# Patient Record
Sex: Male | Born: 1945 | Race: White | Hispanic: No | Marital: Married | Smoking: Never smoker
Health system: Southern US, Community
[De-identification: ages and names within clinical notes are randomized; demographics above are authoritative.]

## PROBLEM LIST (undated history)

## (undated) DIAGNOSIS — R918 Other nonspecific abnormal finding of lung field: Secondary | ICD-10-CM

## (undated) DIAGNOSIS — C7951 Secondary malignant neoplasm of bone: Secondary | ICD-10-CM

## (undated) DIAGNOSIS — C649 Malignant neoplasm of unspecified kidney, except renal pelvis: Secondary | ICD-10-CM

## (undated) DIAGNOSIS — J45909 Unspecified asthma, uncomplicated: Secondary | ICD-10-CM

## (undated) DIAGNOSIS — E119 Type 2 diabetes mellitus without complications: Secondary | ICD-10-CM

## (undated) DIAGNOSIS — G9520 Unspecified cord compression: Secondary | ICD-10-CM

## (undated) DIAGNOSIS — N2889 Other specified disorders of kidney and ureter: Secondary | ICD-10-CM

## (undated) DIAGNOSIS — E875 Hyperkalemia: Secondary | ICD-10-CM

## (undated) DIAGNOSIS — Z8546 Personal history of malignant neoplasm of prostate: Secondary | ICD-10-CM

## (undated) DIAGNOSIS — Z923 Personal history of irradiation: Secondary | ICD-10-CM

## (undated) HISTORY — PX: OTHER SURGICAL HISTORY: SHX169

---

## 1998-06-10 ENCOUNTER — Ambulatory Visit (HOSPITAL_COMMUNITY): Admission: RE | Admit: 1998-06-10 | Discharge: 1998-06-10 | Payer: Self-pay | Admitting: Gastroenterology

## 2006-10-05 ENCOUNTER — Inpatient Hospital Stay (HOSPITAL_COMMUNITY): Admission: EM | Admit: 2006-10-05 | Discharge: 2006-10-07 | Payer: Self-pay | Admitting: Emergency Medicine

## 2006-11-24 ENCOUNTER — Ambulatory Visit (HOSPITAL_COMMUNITY): Admission: RE | Admit: 2006-11-24 | Discharge: 2006-11-24 | Payer: Self-pay | Admitting: Gastroenterology

## 2006-12-29 ENCOUNTER — Ambulatory Visit (HOSPITAL_BASED_OUTPATIENT_CLINIC_OR_DEPARTMENT_OTHER): Admission: RE | Admit: 2006-12-29 | Discharge: 2006-12-29 | Payer: Self-pay | Admitting: Otolaryngology

## 2006-12-29 ENCOUNTER — Encounter (INDEPENDENT_AMBULATORY_CARE_PROVIDER_SITE_OTHER): Payer: Self-pay | Admitting: Otolaryngology

## 2008-04-19 IMAGING — CT CT ANGIO CHEST
2 of 5 series · 19 of 36 positions shown · non-contrast
Comparison: None

CLINICAL DATA: Chest pain and elevated d-dimer.
TECHNIQUE: Multidetector CT imaging of the chest was performed during bolus
injection of intravenous contrast.  Multiplanar CT angiographic image
reconstructions were generated to evaluate the vascular anatomy.

[Series 7: pe 1.0 b20f st · axial · 0.78mm/px · z∈[-182,+74]mm · 16 of 292 slices shown]
[im 18/292  lung]
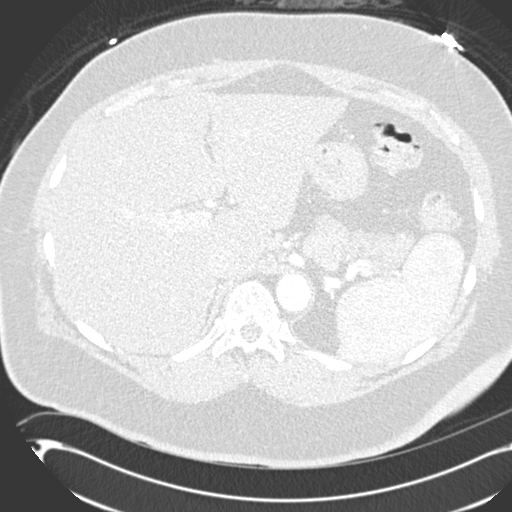
[im 35/292  mediastinal]
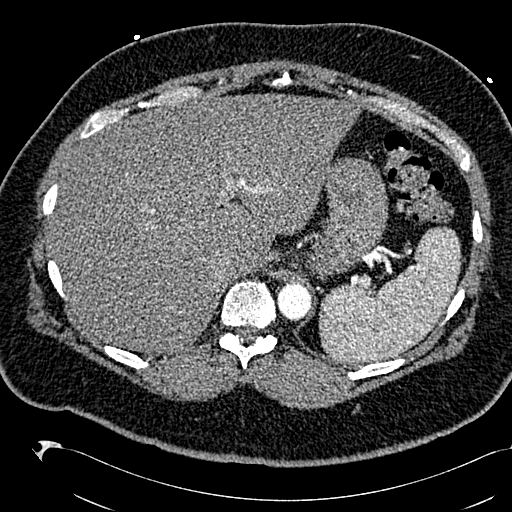
[im 52/292  lung]
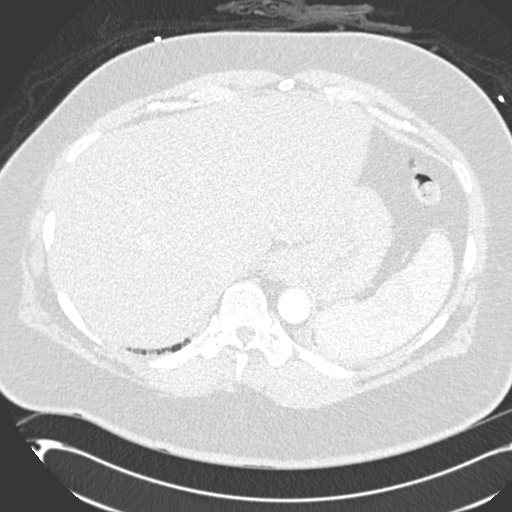
[im 69/292  mediastinal]
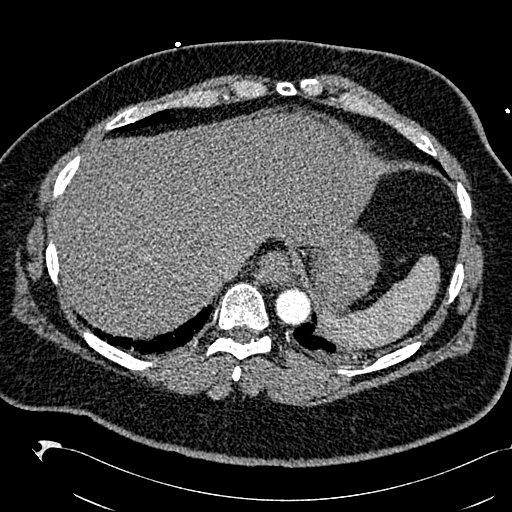
[im 86/292  lung]
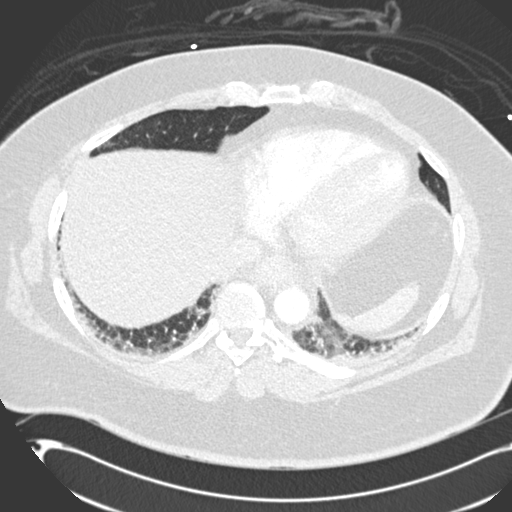
[im 103/292  mediastinal]
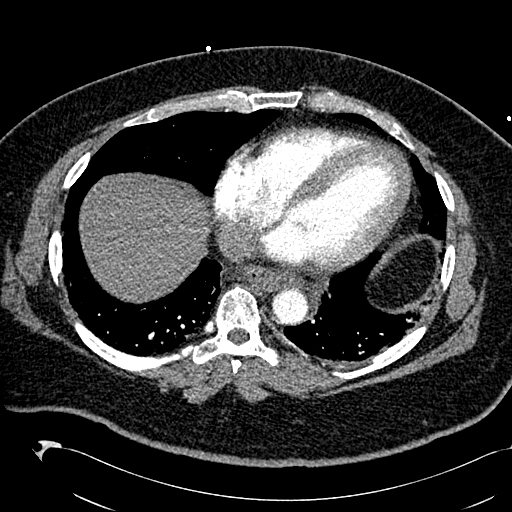
[im 120/292  lung]
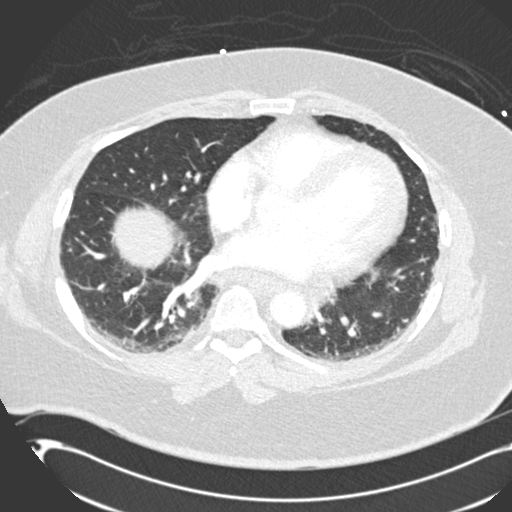
[im 137/292  mediastinal]
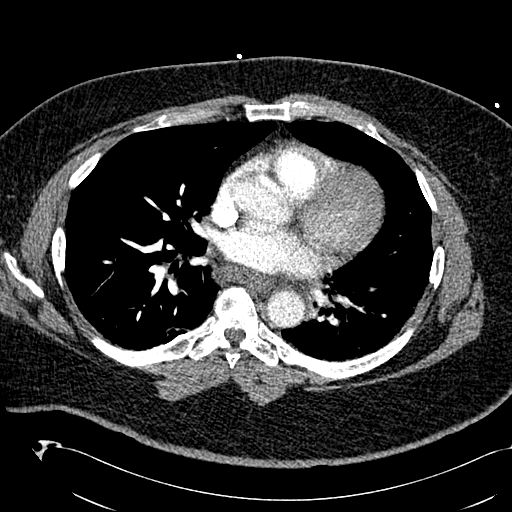
[im 155/292  lung]
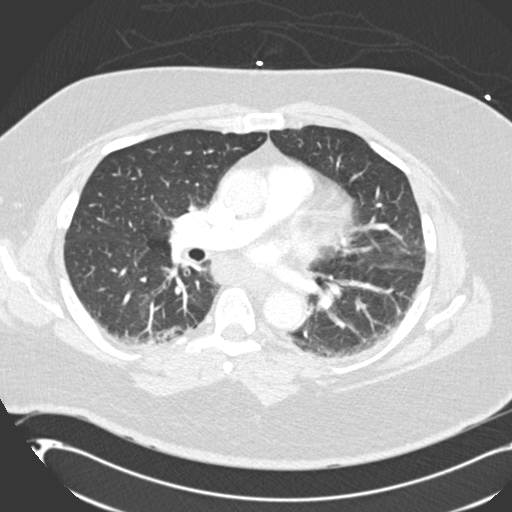
[im 172/292  mediastinal]
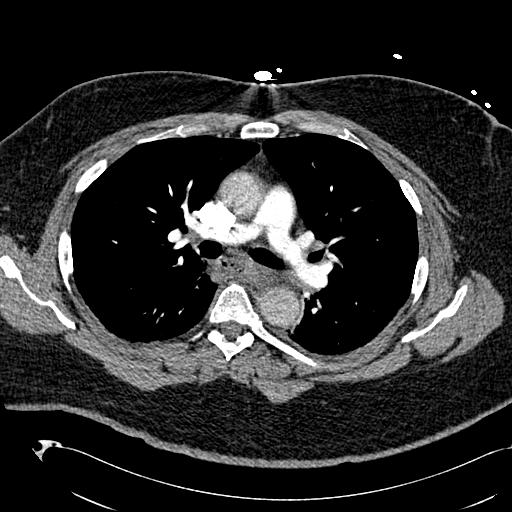
[im 189/292  lung]
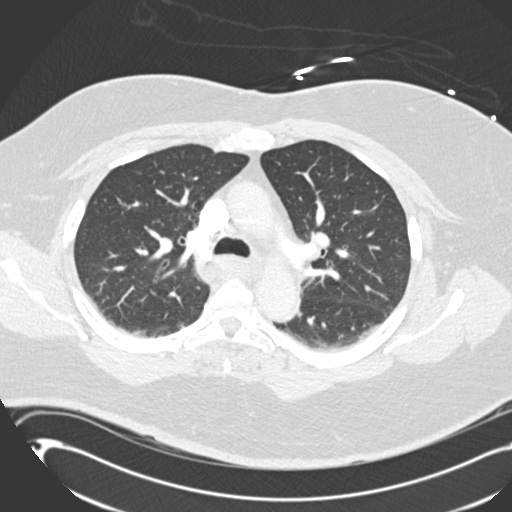
[im 206/292  mediastinal]
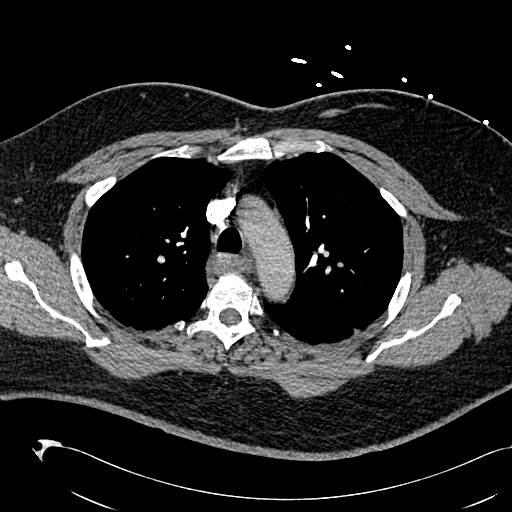
[im 223/292  lung]
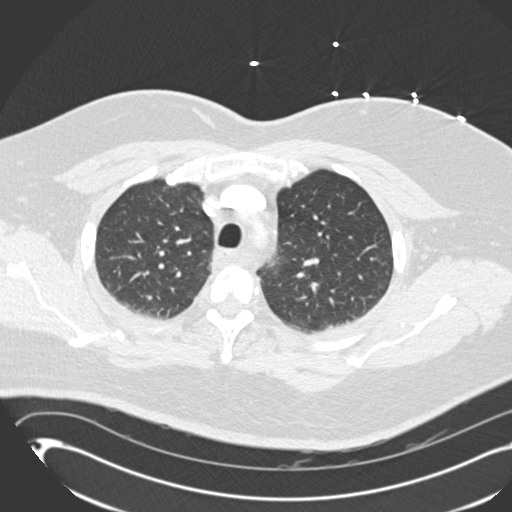
[im 240/292  mediastinal]
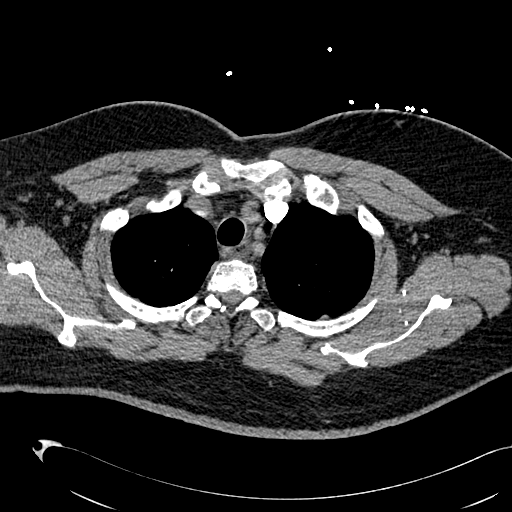
[im 257/292  lung]
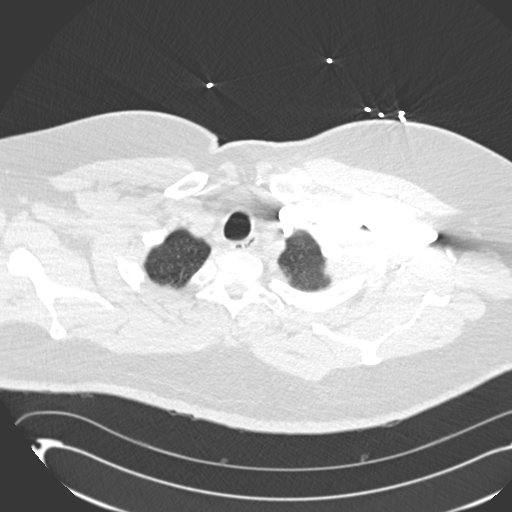
[im 274/292  mediastinal]
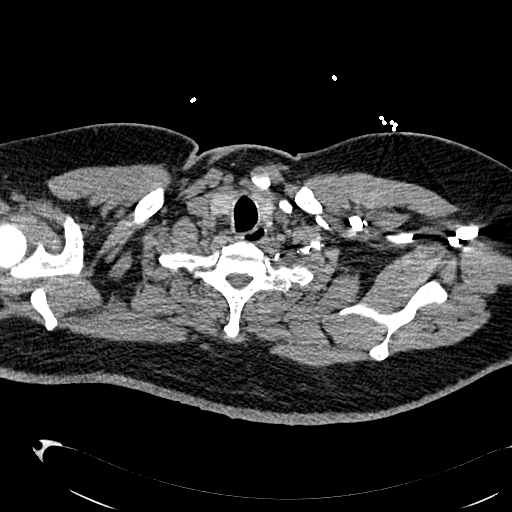

[Series 602: <mpr range> · coronal · 0.78mm/px · 3 of 70 slices shown]
[im 14/70  mediastinal]
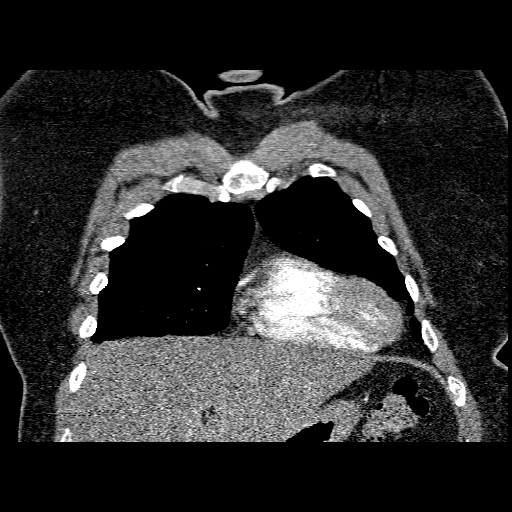
[im 28/70  mediastinal]
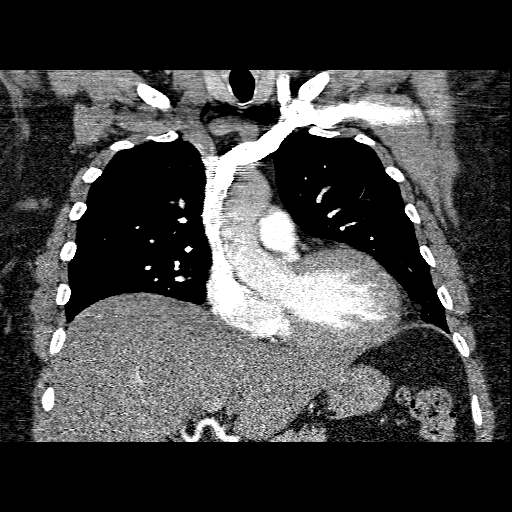
[im 42/70  mediastinal]
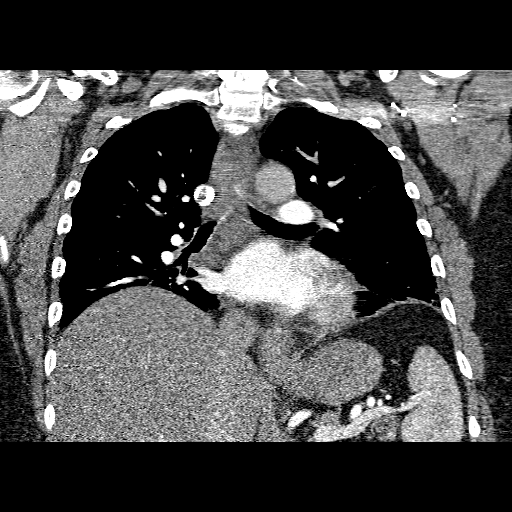

[19 of 36 positions shown; findings below may reference images not displayed]

Contrast:  100 cc Omnipaque 300

CT ANGIOGRAPHY OF CHEST:

There are no filling defects in the opacified pulmonary arteries to suggest the
presence of an acute pulmonary embolus.

No axillary, mediastinal, or hilar lymphadenopathy. The wall of the mid and
distal esophagus appears circumferentially thickened and edematous. Heart size
is normal. There is no pericardial or pleural effusion.

2-3 mm right middle lobe pulmonary nodule seen on image 62. Dependent
atelectasis is noted in both lower lobes.
IMPRESSION: No CT evidence for acute pulmonary embolus.

Circumferential wall thickening in the mid and distal esophagus. Followup
endoscopy or barium swallow recommended to further evaluate.

2-3 mm right middle lobe pulmonary nodule. If the patient has no history of
smoking or malignancy, followup CT chest without contrast in 6 to 12 months
could be used to ensure stability. In the setting of a cancer or smoking
history, 3 month CT followup is recommended..

## 2008-04-19 IMAGING — CR DG CHEST 1V PORT
1 series · 1 of 1 positions shown · non-contrast
Comparison: None.

CLINICAL DATA: Chest pain and cough

[view not recorded]
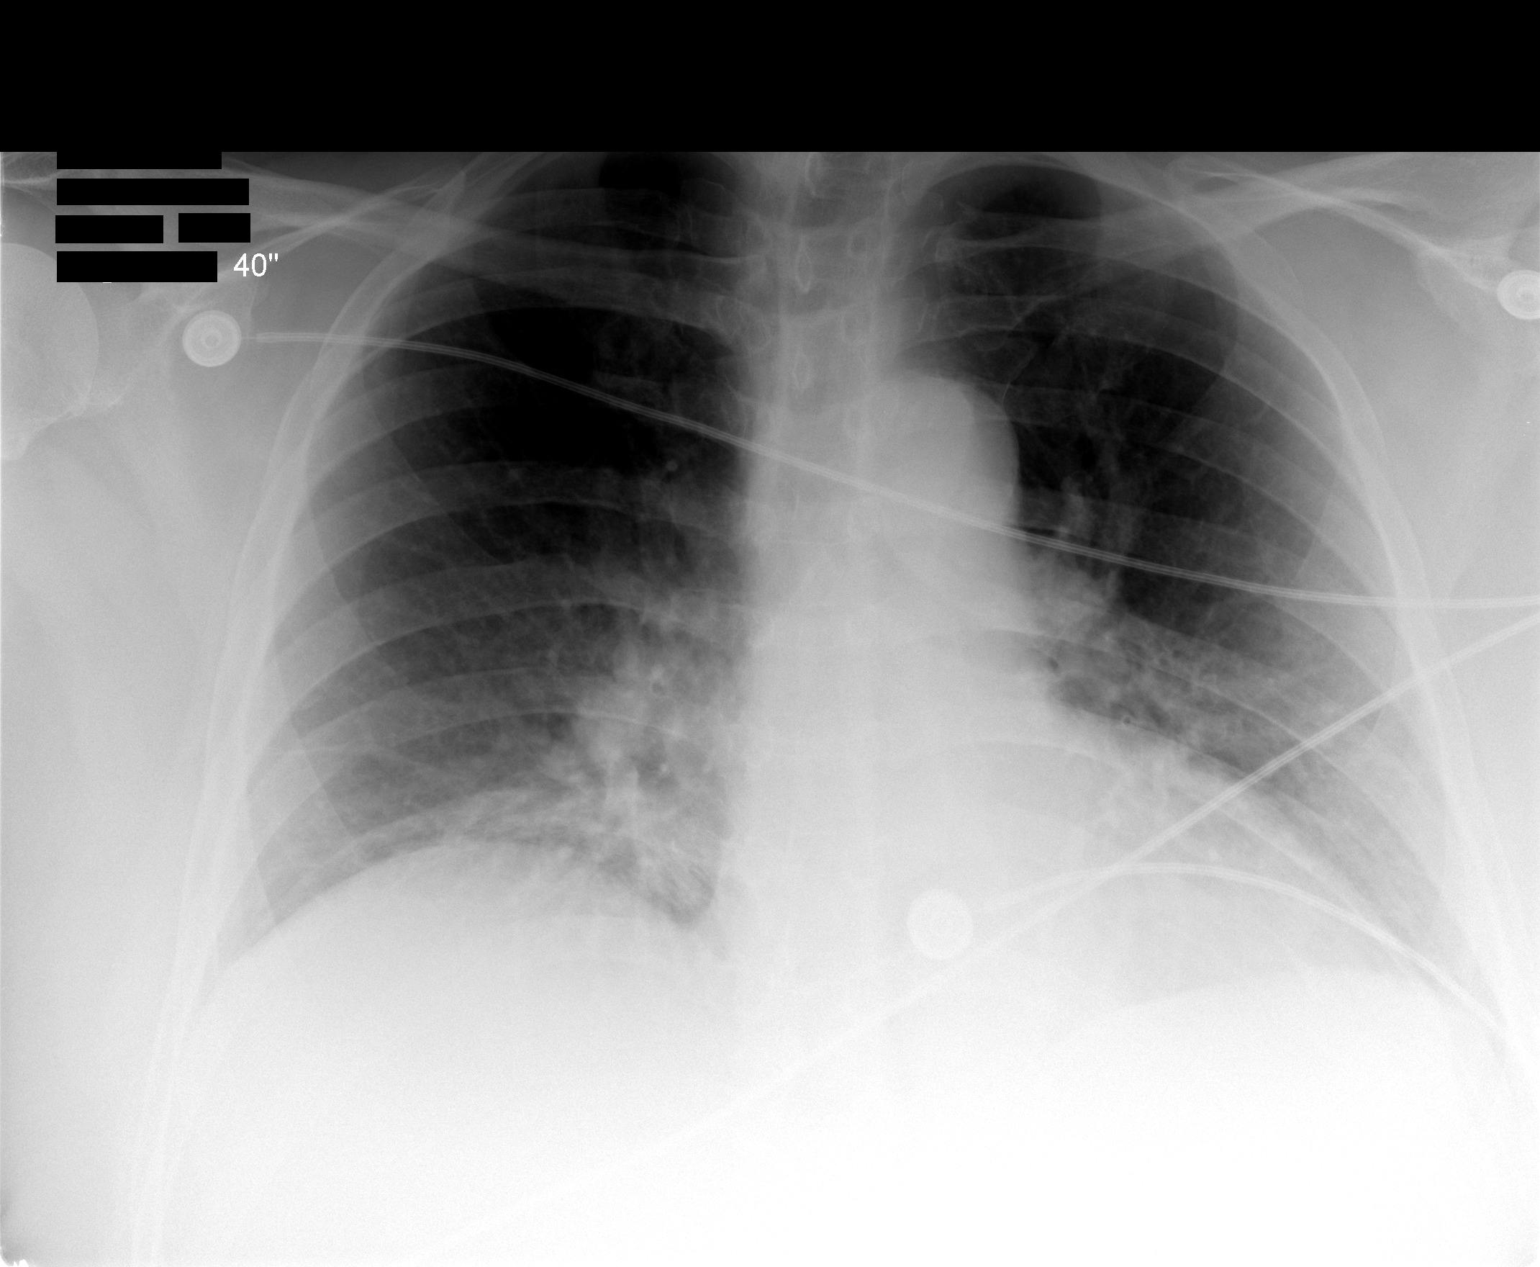

[1 of 1 positions shown; findings below may reference images not displayed]

PORTABLE CHEST - 1 VIEW:

3313 hours. Low lung volumes. Interstitial markings are diffusely coarsened.
There is atelectasis or early infiltrate at the medial right lung base.
Cardiopericardial silhouette is within normal limits for size given the low lung
volumes. Telemetry leads overlie the chest.
IMPRESSION: Low lung volumes with chronic interstitial coarsening and atelectasis or
infiltrate in the medial right base.

## 2010-12-08 NOTE — Op Note (Signed)
NAME:  Leggitt, Nathen                 ACCOUNT NO.:  [id number]   MEDICAL RECORD NO.:  [id number]          PATIENT TYPE:  AMB   LOCATION:  DSC                          FACILITY:  [facility]   PHYSICIAN:  Christopher E. Ezzard Standing, M.D.DATE OF BIRTH:  18-Nov-1945   DATE OF PROCEDURE:  12/29/2006  DATE OF DISCHARGE:                               OPERATIVE REPORT   PREOPERATIVE DIAGNOSIS:  Right neck node.   POSTOPERATIVE DIAGNOSIS:  Right neck node.   OPERATION:  Excisional biopsy of right neck node.   SURGEON:  Dillard Cannon, M.D.   ANESTHESIA:  MAC with 1% Xylocaine with epinephrine local.   COMPLICATIONS:  None.   BRIEF CLINICAL NOTE:  Joshua Black is a 65 year old gentleman who has  noticed a nodule in his right neck now for about 5 months.  On  examination, [name] has a partially superficial approximately to 1.5 cm  to 2 cm nodule just inferior to the angle of mandible on the anterior  portion of the right sternocleidomastoid muscle.  This has gradually  gotten a little bit larger.  He is taken to operating room at this time  for excision of right neck node.   DESCRIPTION OF PROCEDURE:  After adequate IV sedation, the patient's  right neck was prepped with Betadine solution and draped out with  sterile towels.  The nodule was marked out.  There was a small dimple of  skin and ellipse of overlying skin over the nodule was removed.  This  area was injected with Xylocaine with epinephrine.  The ellipse of skin  was then removed.  Dissection was carried down through the subcutaneous  tissue.  The nodule was just deep to the platysma muscle just on the  anterior portion of the right sternocleidomastoid muscle.  It was just  anterior and superficial to the external jugular vein which was  preserved.  The nodule and overlying skin and subcutaneous tissue was  excised and sent to pathology in saline fresh.  Hemostasis was obtained  with cautery.  The defect was closed with 3-0 chromic  sutures  subcutaneously and 5-0 nylon to reapproximate the skin edges.  Dressing  was applied.  [name] tolerated this well.  He will be subsequently  discharged home.   DISPOSITION:  [name] is discharged home later this morning on Tylenol and  Tylenol #3 p.r.n. pain.  Follow up appointment in my office in 1 week  for recheck to review pathology and have sutures removed.           ______________________________  Kristine Garbe Ezzard Standing, M.D.     CEN/MEDQ  D:  12/29/2006  T:  12/29/2006  Job:  [id number]   cc:   C. Duane Lope, M.D.

## 2010-12-11 NOTE — Op Note (Signed)
NAME:  Joshua Black, Joshua Black                 ACCOUNT NO.:  [id number]   MEDICAL RECORD NO.:  [id number]          PATIENT TYPE:  AMB   LOCATION:  ENDO                         FACILITY:  [facility]   PHYSICIAN:  John C. Madilyn Fireman, M.D.    DATE OF BIRTH:  1946-04-02   DATE OF PROCEDURE:  11/24/2006  DATE OF DISCHARGE:                               OPERATIVE REPORT   PROCEDURE:  Esophagogastroduodenoscopy with esophageal dilatation.   INDICATIONS FOR PROCEDURE:  Dysphagia and suggestion of thickened distal  esophagus on previous CT scan.   PROCEDURE IN DETAIL:  The patient was placed in the left lateral  decubitus position and placed on the pulse monitor with continuous low  flow oxygen delivered by nasal cannula.  He was sedated with 75 mcg IV  fentanyl and 5 mg IV Versed.  The Olympus video endoscope was advanced  under direct vision through the oropharynx and esophagus.  The esophagus  was straight and of normal caliber at the squamocolumnar line at 38 cm.  There was no visible suspicion of thickening, neoplasm or inflammation.  There was a very thin lower esophageal ring seen only when the esophagus  was completely dilated.  There was no resistance to passage of the scope  beyond it.  The duodenum was entered.  The stomach was entered and a  small amount of liquid secretions were suctioned from the fundus.  On  retroflexed view, the cardia was unremarkable.  The fundus, body and  pylorus all appeared normal.  The duodenum was entered and both the bulb  and second portion were well inspected and appeared to be within normal  limits.  Savory guide wires were placed through the endoscope channel  and the scope withdrawn.  An 18 mm Savory dilator was passed over the  guide wire with minimal resistance and no blood seen on withdrawal.  The  dilator was removed together with the wire and the patient returned to  the recovery room in stable condition.  He tolerated the procedure well  and there were no  immediate complications.   IMPRESSION:  Widely patent lower esophageal ring dilated 18 mm.   PLAN:  Advance diet and observe response to dilatation.           ______________________________  Everardo All Madilyn Fireman, M.D.     JCH/MEDQ  D:  11/24/2006  T:  11/24/2006  Job:  [id number]   cc:   C. Duane Lope, M.D.

## 2010-12-11 NOTE — H&P (Signed)
NAME:  Joshua Black, Joshua Black                 ACCOUNT NO.:  [id number]   MEDICAL RECORD NO.:  [id number]          PATIENT TYPE:  INP   LOCATION:  1426                         FACILITY:  [facility]   PHYSICIAN:  Lucita Ferrara, MD         DATE OF BIRTH:  [birth year]   DATE OF ADMISSION:  10/05/2006  DATE OF DISCHARGE:                              HISTORY & PHYSICAL   The patient is a 65 year old male with a past medical history  significant for obesity who presents to [facility] with  a chief complaint of chest pain. The chest pain started 4 hours ago  prior to admission. It is located bilaterally, worse on inspiration.  There is no radiation. The chest pain is pleuritic and aching in nature.  The patient does complain of a cough. The chest pain is nonreproducible  but it is heavy and feeling like something is sitting on his chest. He  has had no personal history of coronary artery disease, previous stress  test and is currently not followed up by a cardiologist. The patient  does have relative risk factor positive for family history of premature  coronary artery disease. The patient is a nonsmoker, hypertensive, but  he is morbidly obese.   REVIEW OF SYSTEMS:  Otherwise negative. He denies any fevers or chills.  He denies any focal abdominal complaints. Otherwise neurological status  is negative, no numbness, tingling or focal neurological finds.   PAST MEDICAL HISTORY:  As above. Morbid obesity and otherwise no other  history.   PAST SURGICAL HISTORY:  None.   MEDICATIONS:  The patient is taking Pepto Bismol p.r.n.   ALLERGIES:  CODEINE. Ashby Dawes of allergy is unknown.   PHYSICAL EXAMINATION:  GENERAL:  The patient is in no acute distress.  VITAL SIGNS:  Blood pressure is 136/94, pulse ox 94-96% on room air.  Pulse is 96-110, respirations 16.  HEENT:  Normocephalic, atraumatic. Sclera nonicteric. Mucous membranes  moist.  NECK:  Supple, no JVD, no carotid bruits. PERRLA.  CARDIOVASCULAR:  S1, S2. Regular rate and rhythm. No murmurs, rubs or  clicks.  ABDOMEN:  Soft, nontender, nondistended. Positive bowel sounds.  LUNGS:  Clear to auscultation bilaterally. No rhonchi, rales or wheezes.  NEUROLOGIC:  Alert and oriented x3. Cranial nerves II-XII grossly  intact.  EXTREMITIES:  No clubbing, cyanosis or edema.   LABORATORY DATA:  Zamarron count of 8.9, hemoglobin 15.8, hematocrit of  44.8, platelets 262. D-dimer was elevated at 1.3. CT angiography of the  chest shows no CT evidence of acute pulmonary embolus although there is  circumferential wall thickening in the mid to distal esophagus. A barium  swallow is recommended for further evaluation. Sodium 134, potassium  4.3, chloride 99, CO2 25, glucose 318, BUN 21, creatinine 1. Cardiac  enzymes first set negative. ABGs drawn and pending. Chest x-ray shows  low lung volumes and chronic interstitial coursing and atelectasis or  infiltrate in the medial right base consistent with possible pneumonia.  EKG shows possible left atrial enlargement, left axis deviation, left  ventricular hypertrophy and nonspecific T wave  abnormalities, sinus  tachycardia.   ASSESSMENT/PLAN:  A 65 year old male with pleuritic chest pain. Will  admit for IV antibiotics and IV hydration secondary to possible right  middle lobe atelectasis and pneumonia. Will also monitor cardiac enzymes  x3. Will admit to the telemetry floor given his chest pain. Will repeat  EKG in the morning. The patient may need a pharmacological stress test  to rule out ischemia. Will continue to monitor hemodynamic status.  Currently the patient is stable. I have explained the plans and  procedures of this admission and the patient understands. In regards to  the findings found on the CT scan, a showing of circumferential wall  thickening in the mid to distal esophagus, the patient will need an  endoscopy at some point. May do that in this admission or the  patient  maybe can get it on an outpatient basis. Also there was a 3-mm lung  nodule. The patient will need a followup CT scan in 6-12 months to  followup this nodule.      Lucita Ferrara, MD  Electronically Signed     RR/MEDQ  D:  10/05/2006  T:  10/06/2006  Job:  [id number]

## 2011-05-13 LAB — BASIC METABOLIC PANEL
BUN: 13
Chloride: 106
GFR calc Af Amer: >60
GFR calc non Af Amer: >60
Potassium: 4.4

## 2011-05-13 LAB — POCT HEMOGLOBIN-HEMACUE
Hemoglobin: 14.6
Operator id: 23949

## 2011-08-30 ENCOUNTER — Other Ambulatory Visit: Payer: Self-pay | Admitting: Family Medicine

## 2011-08-30 DIAGNOSIS — R1011 Right upper quadrant pain: Secondary | ICD-10-CM

## 2011-08-31 ENCOUNTER — Ambulatory Visit
Admission: RE | Admit: 2011-08-31 | Discharge: 2011-08-31 | Disposition: A | Discharge status: Home / Self Care | Payer: 59 | Source: Ambulatory Visit | Attending: Family Medicine | Admitting: Family Medicine

## 2011-08-31 DIAGNOSIS — R1011 Right upper quadrant pain: Secondary | ICD-10-CM

## 2011-09-03 ENCOUNTER — Other Ambulatory Visit: Payer: Self-pay | Admitting: Family Medicine

## 2012-05-11 ENCOUNTER — Other Ambulatory Visit: Payer: Self-pay | Admitting: Family Medicine

## 2012-10-23 ENCOUNTER — Encounter: Payer: Self-pay | Admitting: Radiation Oncology

## 2012-10-24 ENCOUNTER — Other Ambulatory Visit: Payer: Self-pay | Admitting: Radiation Oncology

## 2012-10-24 ENCOUNTER — Ambulatory Visit
Admission: RE | Admit: 2012-10-24 | Discharge: 2012-10-24 | Disposition: A | Discharge status: Home / Self Care | Payer: 59 | Source: Ambulatory Visit | Attending: Radiation Oncology | Admitting: Radiation Oncology

## 2012-10-24 VITALS — BP 131/81 | HR 76 | Temp 97.7°F | Ht 75.0 in | Wt 302.6 lb

## 2012-10-24 DIAGNOSIS — C7952 Secondary malignant neoplasm of bone marrow: Secondary | ICD-10-CM | POA: Insufficient documentation

## 2012-10-24 DIAGNOSIS — R110 Nausea: Secondary | ICD-10-CM | POA: Insufficient documentation

## 2012-10-24 DIAGNOSIS — C7951 Secondary malignant neoplasm of bone: Secondary | ICD-10-CM | POA: Insufficient documentation

## 2012-10-24 DIAGNOSIS — C649 Malignant neoplasm of unspecified kidney, except renal pelvis: Secondary | ICD-10-CM | POA: Insufficient documentation

## 2012-10-24 HISTORY — DX: Morbid (severe) obesity due to excess calories: E66.01

## 2012-10-24 HISTORY — DX: Personal history of malignant neoplasm of prostate: Z85.46

## 2012-10-24 HISTORY — DX: Other nonspecific abnormal finding of lung field: R91.8

## 2012-10-24 HISTORY — DX: Unspecified cord compression: G95.20

## 2012-10-24 HISTORY — DX: Unspecified asthma, uncomplicated: J45.909

## 2012-10-24 HISTORY — DX: Hyperkalemia: E87.5

## 2012-10-24 HISTORY — DX: Other specified disorders of kidney and ureter: N28.89

## 2012-10-24 HISTORY — DX: Secondary malignant neoplasm of bone: C79.51

## 2012-10-24 HISTORY — DX: Type 2 diabetes mellitus without complications: E11.9

## 2012-10-24 HISTORY — DX: Malignant neoplasm of unspecified kidney, except renal pelvis: C64.9

## 2012-10-24 MED ORDER — ONDANSETRON HCL 8 MG PO TABS: 8.0000 mg | ORAL_TABLET | Freq: Three times a day (TID) | ORAL | Status: AC | PRN

## 2012-10-24 NOTE — Progress Notes (Addendum)
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 At the time of diagnosis he demonstrated questionable metastatic disease in the lumbar spine. Biopsies were negative.  About 2 months ago he developed dull pain in his low thoracic region. He reports that the pain is worse with sitting still. When he moves about it is easier to tolerate. The pain radiates to his right flank. He denies any numbness or weakness in his legs. He denies any problems with urination or bowel movements. He denies any neck or upper back pain. He did "break his upper back " in high school, of note.  Imaging performed recently at [facility] includes a negative brain MRI. He also underwent MRI imaging of his entire spine on 10/14/2012. I reviewed these images with the patient. These images demonstrate multiple bone metastases at C3-C4 C6-C7 T1-T2 T9-T10 T11-T12 and L1. Notably he has epidural and right neural foraminal invasion from T8-12. There is a large mass forming lesion at the T12, obstructing the right T11/T12 neural foramen and mildly displacing the thecal sac to the left.  It should be noted that CT scans on 10/06/2012 had originally appreciated these lesions, with concern of cord compression. However cord compression is not mentioned in the MRI report. He denies any neurologic compromise and has not started any steroids. It should be noted that the CT scans in March demonstrated locally recurrent renal cell carcinoma on the right, with an enlarging exophytic enhancing left renal mass concerning for an additional site of renal cell carcinoma. He has  subcentimeter pulmonary nodules, stable since  09/08/11.  He has not yet started any systemic therapy. This will be discussed further at [facility], per the patient.  PREVIOUS RADIATION THERAPY: No  PAST MEDICAL HISTORY:  has a past medical history of Renal cell cancer; H/O prostate cancer; Bone metastases (10/14/12 MRI); Spinal cord compression (?); Left renal mass (10/06/12 CT Scan Abodoment); Childhood asthma; Diabetes (since 2006); Pulmonary nodules (10/06/12 CT SCAN); Hyperkalemia (10/12/12 dictation); Diabetes; and Morbid obesity.    PAST SURGICAL HISTORY: Past Surgical History  Procedure Laterality Date  . Core biopsy and fine needle aspiration N/A 03/10/13    Bone Lesion - L1(A&B)  No Malignant Cells  . Right nephrectomy Right     with IVC Tumor Thrombectomy and a Retroperitoneal Lymph Node Dissection - Stage T3b: 0/8 negative Nodes  . Facial reconstructive surgery      Dog Bite  . Roator cuff repair Left     FAMILY HISTORY: family history includes Cancer (age of onset: 60) in an unspecified family member; Coronary artery disease in his father; and Prostate cancer in his brother.  SOCIAL HISTORY:  reports that he has never smoked. He does not have any smokeless tobacco history on file. He reports that he does not drink alcohol.  ALLERGIES: Review of patient's allergies indicates no known allergies.  MEDICATIONS:  Current Outpatient Prescriptions  Medication Sig Dispense Refill  . acetaminophen (TYLENOL) 500 MG tablet Take 500 mg by mouth every 6 (six) hours as needed for pain.      . metFORMIN (GLUCOPHAGE) 850 MG tablet Take 850 mg by mouth  3 (three) times daily.      . traMADol (ULTRAM) 50 MG tablet Take 50 mg by mouth as needed for pain. Take 50mg  po Every 3-6 hours as needed      . ondansetron (ZOFRAN) 8 MG tablet Take 1 tablet (8 mg total) by mouth every 8 (eight) hours as needed for nausea.  20 tablet  5  . oxycodone (OXY-IR) 5 MG capsule Take 5 mg by mouth every 4 (four) hours as  needed.      Marland Kitchen oxyCODONE (OXYCONTIN) 10 MG 12 hr tablet Take 10 mg by mouth every 12 (twelve) hours.      . prochlorperazine (COMPAZINE) 10 MG tablet Take 10 mg by mouth every 6 (six) hours as needed.       No current facility-administered medications for this encounter.    REVIEW OF SYSTEMS:  A 15 point review of systems is documented in the electronic medical record. This was obtained by the nursing staff. However, I reviewed this with the patient to discuss relevant findings and make appropriate changes.  Pertinent items are noted in HPI.   PHYSICAL EXAM:   Vitals with Age-Percentiles 10/24/2012  Length 190.5 cm  Systolic 131  Diastolic 81  Pulse 76  Weight 119.147 kg   General: Alert and oriented, in no acute distress HEENT: Head is normocephalic. Pupils are equally round and reactive to light. Extraocular movements are intact. Oropharynx is clear. Neck: Neck is supple, no palpable cervical or supraclavicular lymphadenopathy. Heart: Regular in rate and rhythm with no murmurs, rubs, or gallops. Chest: Clear to auscultation bilaterally, with no rhonchi, wheezes, or rales. Abdomen: Soft, nontender, nondistended, with no rigidity or guarding. Extremities: No cyanosis or edema. Lymphatics: No concerning lymphadenopathy. Skin: No concerning lesions. Musculoskeletal: symmetric strength and muscle tone throughout. Pain centralized around T12, radiating to right flank Neurologic: Cranial nerves II through XII are grossly intact. No obvious focalities. Speech is fluent. Coordination is intact. Psychiatric: Judgment and insight are intact. Affect is appropriate.   LABORATORY DATA:  Lab Results  Component Value Date   HGB 14.6 12/29/2006   CMP     Component Value Date/Time   NA 139 12/27/2006 1050   K 4.4 12/27/2006 1050   CL 106 12/27/2006 1050   CO2 29 12/27/2006 1050   GLUCOSE 113* 12/27/2006 1050   BUN 13 12/27/2006 1050   CREATININE 0.99 12/27/2006 1050   CALCIUM 9.2 12/27/2006 1050    GFRNONAA >60 12/27/2006 1050   GFRAA  Value: >60        The eGFR has been calculated using the MDRD equation. This calculation has not been validated in all clinical 12/27/2006 1050      RADIOGRAPHY: No results found.    IMPRESSION/PLAN: [age] yo old gentleman with metastatic renal cell carcinoma to the spine. We discussed my recommendations as follows  1) He reports Nausea: Rx Zofran today, as requested by patient  2) Bone Metastases- most ominous in lower thoracic spine through L1- no neurologic sx: Simulation tomorrow, start RT 10/26/12. Anticipate 37.5 Gy in 15 fractions, treating from the top of T8 to the bottom of L1.  It was a pleasure meeting the patient today. We discussed the risks, benefits, and side effects of radiotherapy. Side effects may include but not necessarily be limited to fatigue and skin irritation as well as GI upset. We discussed relatively rare risk of internal organ damage. I will be mindful of minimizing dose to his left kidney.  No guarantees of treatment  were given. A consent form was signed and placed in the patient's medical record. The patient is enthusiastic about proceeding with treatment. I look forward to participating in the patient's care.  Will hold off on Dexamethasone unless he develops neurologic symptoms.   I spent 45 minutes face to face with the patient and more than 50% of that time was spent in counseling and/or coordination of care.    __________________________________________   Lonie Peak, MD

## 2012-10-24 NOTE — Progress Notes (Signed)
Joshua Black here today for assessment and potential treatment of his C-spine and T-spine.  He is ambulating gingerly and grades his pain as a level 6 currently.  He is constantly trying to reposition in the chair.   He just started Oxycontin 10mg  tabs po  Saturday with  Oxy-Ir nd also has Tramadol prn.

## 2012-10-25 ENCOUNTER — Ambulatory Visit
Admission: RE | Admit: 2012-10-25 | Discharge: 2012-10-25 | Disposition: A | Discharge status: Home / Self Care | Payer: 59 | Source: Ambulatory Visit | Attending: Radiation Oncology | Admitting: Radiation Oncology

## 2012-10-25 ENCOUNTER — Encounter: Payer: Self-pay | Admitting: Radiation Oncology

## 2012-10-25 DIAGNOSIS — C649 Malignant neoplasm of unspecified kidney, except renal pelvis: Secondary | ICD-10-CM | POA: Insufficient documentation

## 2012-10-25 DIAGNOSIS — C7951 Secondary malignant neoplasm of bone: Secondary | ICD-10-CM | POA: Insufficient documentation

## 2012-10-25 DIAGNOSIS — Z510 Encounter for antineoplastic radiation therapy: Secondary | ICD-10-CM | POA: Insufficient documentation

## 2012-10-25 DIAGNOSIS — R112 Nausea with vomiting, unspecified: Secondary | ICD-10-CM | POA: Insufficient documentation

## 2012-10-25 DIAGNOSIS — Z79899 Other long term (current) drug therapy: Secondary | ICD-10-CM | POA: Insufficient documentation

## 2012-10-25 NOTE — Progress Notes (Addendum)
  Radiation Oncology         [phone] ________________________________  Name: Joshua Black MRN: [id number]  Date: 10/25/2012  DOB: 08-Apr-1946  SIMULATION AND TREATMENT PLANNING NOTE  outpatient  DIAGNOSIS:  RCC met to spine  NARRATIVE:  The patient was brought to the CT Simulation planning suite.  Identity was confirmed.  All relevant records and images related to the planned course of therapy were reviewed.  The patient freely provided informed written consent to proceed with treatment after reviewing the details related to the planned course of therapy. The consent form was witnessed and verified by the simulation staff.    Then, the patient was set-up in a stable reproducible supine position - legs in vaclock - for radiation therapy.  CT images were obtained.  Surface markings were placed.  The CT images were loaded into the planning software.    TREATMENT PLANNING NOTE: Treatment planning then occurred.  The radiation prescription was entered and confirmed.    A total of 3 medically necessary complex treatment devices were fabricated and supervised by me - vaclock and 2 fields with MLCs for custom blocking.    The patient will receive 37.5 Gy in 15 fractions from T8-L1.  3D conformal RT will be used to spare his spinal cord and kidney from excessive dose. DVH requested of GTV cord and left kidney.  Daily CBCT imaging will be used to ensure sparing of single kidney and accurate set up.  -----------------------------------  Lonie Peak, MD

## 2012-10-26 ENCOUNTER — Encounter: Payer: Self-pay | Admitting: Radiation Oncology

## 2012-10-26 ENCOUNTER — Ambulatory Visit
Admission: RE | Admit: 2012-10-26 | Discharge: 2012-10-26 | Disposition: A | Discharge status: Home / Self Care | Payer: 59 | Source: Ambulatory Visit | Attending: Radiation Oncology | Admitting: Radiation Oncology

## 2012-10-26 ENCOUNTER — Telehealth: Payer: Self-pay | Admitting: Radiation Oncology

## 2012-10-26 NOTE — Progress Notes (Signed)
Simulation verification note: The patient underwent simulation verification today for treatment to his TL spine. His isocenter was in good position and the multileaf collimators contoured the treatment volume appropriately.

## 2012-10-26 NOTE — Telephone Encounter (Signed)
Per [facility], faxed consult to Dr. Susie Cassette and Dr. Bonnell Public, both [phone].  Received confirmation on both.

## 2012-10-27 ENCOUNTER — Inpatient Hospital Stay
Admission: RE | Admit: 2012-10-27 | Discharge: 2012-10-27 | Disposition: A | Discharge status: Home / Self Care | Payer: Self-pay | Source: Ambulatory Visit | Attending: Radiation Oncology | Admitting: Radiation Oncology

## 2012-10-27 ENCOUNTER — Other Ambulatory Visit: Payer: Self-pay | Admitting: Radiation Oncology

## 2012-10-27 ENCOUNTER — Ambulatory Visit
Admission: RE | Admit: 2012-10-27 | Discharge: 2012-10-27 | Disposition: A | Discharge status: Home / Self Care | Payer: 59 | Source: Ambulatory Visit | Attending: Radiation Oncology | Admitting: Radiation Oncology

## 2012-10-27 DIAGNOSIS — C649 Malignant neoplasm of unspecified kidney, except renal pelvis: Secondary | ICD-10-CM

## 2012-10-30 ENCOUNTER — Ambulatory Visit
Admission: RE | Admit: 2012-10-30 | Discharge: 2012-10-30 | Disposition: A | Discharge status: Home / Self Care | Payer: 59 | Source: Ambulatory Visit | Attending: Radiation Oncology | Admitting: Radiation Oncology

## 2012-10-30 VITALS — BP 165/84 | HR 65 | Temp 98.3°F | Ht 75.0 in | Wt 273.1 lb

## 2012-10-30 DIAGNOSIS — C7951 Secondary malignant neoplasm of bone: Secondary | ICD-10-CM

## 2012-10-30 MED ORDER — TRAMADOL HCL 50 MG PO TABS: 50.0000 mg | ORAL_TABLET | ORAL | Status: AC | PRN

## 2012-10-30 MED ORDER — RADIAPLEXRX EX GEL
Freq: Once | CUTANEOUS | Status: AC
  Administered 2012-10-30: 1 via TOPICAL

## 2012-10-30 NOTE — Addendum Note (Signed)
Encounter addended by: Eduardo Osier, RN on: 10/30/2012  5:04 PM<BR>     Documentation filed: Orders

## 2012-10-30 NOTE — Progress Notes (Signed)
   Weekly Management Note:  Outpatient Current Dose:  7.5 Gy  Projected Dose: 37.5 Gy   Narrative:  The patient presents for routine under treatment assessment.  CBCT/MVCT images/Port film x-rays were reviewed.  The chart was checked. He reports a lot of nausea. Has started vomiting intermittently this weekend. Has not been taking his Zofran around the clock. His urine is still clear. His he denies lightheadedness. He has lost about 29 pounds in the past week. He is very tired. No diarrhea.  Physical Findings:  height is 6\' 3"  (1.905 m) and weight is 273 lb 1.6 oz (123.877 kg). His temperature is 98.3 F (36.8 C). His blood pressure is 165/84 and his pulse is 65.  Lying on a table, uncomfortable-appearing  Impression:  The patient is experiencing acute effects likely related to radiotherapy - but also related to his cancer; there is not a huge amount of bowel in his treatment fields.  Plan:  Continue radiotherapy as planned. Takes Zofran every 8 hours. I will set up a referral to nutrition. Spoke about   signs of dehydration to warrant IV fluids. Refill for Ultram given today  ________________________________   Lonie Peak, M.D.

## 2012-10-30 NOTE — Addendum Note (Signed)
Encounter addended by: Eduardo Osier, RN on: 10/30/2012  5:20 PM<BR>     Documentation filed: Inpatient MAR

## 2012-10-30 NOTE — Progress Notes (Signed)
Joshua Black here for weekly under treatment visit.  He has had 3/15 fractions to his spine.  He does have pain in his left side and back that he rates at a 6/10.  He does have fatigue.  He also has nausea and vomiting and did vomit in the exam room.  He has been taking zofran for this.  He is also having trouble sleeping.  He also is requesting a refill for ultram.  He was given the Radiation therapy and you booklet and educated on the potential side effects of radiation including fatigue, nausea and skin changes.  He was given radiaplex gel and instructed to apply it to his back and abdomen twice a day with the first application at least 4 hours before his treament time.  He was also instructed to follow a high protein diet as tolerated by his nausea.  He has lost 25 lbs since 4/1.

## 2012-10-31 ENCOUNTER — Ambulatory Visit
Admission: RE | Admit: 2012-10-31 | Discharge: 2012-10-31 | Disposition: A | Discharge status: Home / Self Care | Payer: 59 | Source: Ambulatory Visit | Attending: Radiation Oncology | Admitting: Radiation Oncology

## 2012-11-01 ENCOUNTER — Ambulatory Visit
Admission: RE | Admit: 2012-11-01 | Discharge: 2012-11-01 | Disposition: A | Discharge status: Home / Self Care | Payer: 59 | Source: Ambulatory Visit | Attending: Radiation Oncology | Admitting: Radiation Oncology

## 2012-11-02 ENCOUNTER — Ambulatory Visit
Admission: RE | Admit: 2012-11-02 | Discharge: 2012-11-02 | Disposition: A | Discharge status: Home / Self Care | Payer: 59 | Source: Ambulatory Visit | Attending: Radiation Oncology | Admitting: Radiation Oncology

## 2012-11-03 ENCOUNTER — Ambulatory Visit
Admission: RE | Admit: 2012-11-03 | Discharge: 2012-11-03 | Disposition: A | Discharge status: Home / Self Care | Payer: 59 | Source: Ambulatory Visit | Attending: Radiation Oncology | Admitting: Radiation Oncology

## 2012-11-06 ENCOUNTER — Encounter: Payer: Self-pay | Admitting: Radiation Oncology

## 2012-11-06 ENCOUNTER — Ambulatory Visit
Admission: RE | Admit: 2012-11-06 | Discharge: 2012-11-06 | Disposition: A | Discharge status: Home / Self Care | Payer: 59 | Source: Ambulatory Visit | Attending: Radiation Oncology | Admitting: Radiation Oncology

## 2012-11-06 ENCOUNTER — Ambulatory Visit: Admission: RE | Admit: 2012-11-06 | Payer: 59 | Source: Ambulatory Visit | Admitting: Radiation Oncology

## 2012-11-06 VITALS — BP 124/107 | HR 74 | Temp 97.8°F | Wt 297.0 lb

## 2012-11-06 DIAGNOSIS — C7951 Secondary malignant neoplasm of bone: Secondary | ICD-10-CM

## 2012-11-06 NOTE — Progress Notes (Signed)
   Weekly Management Note:  Outpatient Current Dose:  20 Gy  Projected Dose: 37.5 Gy   Narrative:  The patient presents for routine under treatment assessment.  CBCT/MVCT images/Port film x-rays were reviewed.  The chart was checked.  Pain a bit better.  Nausea managed with Zofran, Phenergan. "wheezy" - wife thinks this may be partly from anxiety.  Coughing up "Guck"  Physical Findings:  weight is 297 lb (134.718 kg). His temperature is 97.8 F (36.6 C). His blood pressure is 124/107 and his pulse is 74.   Lungs unremarkable to auscultation.  Impression:  The patient is tolerating radiotherapy.  Plan:  Continue radiotherapy as planned. Doubtful respiration issues are from stable pulmonary nodules from March CT scan.  Will continue to follow.  ________________________________   Lonie Peak, M.D.

## 2012-11-06 NOTE — Progress Notes (Signed)
Mr. Fletchall has received 6 fractions to his  T8 - L1 spine.  He C/O pain as a level 5 on a scale of 0-10 in the right lumbar.  He C/O fatigue and "I am only good for a couple of hours a day".  He C/O difficulty breathing at night due to SOB and wheezing and Coughing up thickened saliva.  After talking with his pharmacy he will start Mucinex and Allegra.  Suggested he also get a cool mist humidifier at the bedside too.  His 02 sat today while sitting is 97% on RA.

## 2012-11-07 ENCOUNTER — Ambulatory Visit
Admission: RE | Admit: 2012-11-07 | Discharge: 2012-11-07 | Disposition: A | Discharge status: Home / Self Care | Payer: 59 | Source: Ambulatory Visit | Attending: Radiation Oncology | Admitting: Radiation Oncology

## 2012-11-08 ENCOUNTER — Encounter: Payer: 59 | Admitting: Nutrition

## 2012-11-08 ENCOUNTER — Ambulatory Visit
Admission: RE | Admit: 2012-11-08 | Discharge: 2012-11-08 | Disposition: A | Discharge status: Home / Self Care | Payer: 59 | Source: Ambulatory Visit | Attending: Radiation Oncology | Admitting: Radiation Oncology

## 2012-11-08 ENCOUNTER — Encounter: Payer: Self-pay | Admitting: Nutrition

## 2012-11-08 NOTE — Progress Notes (Signed)
Patient did not show up nor did he cancel his nutrition appointment scheduled for today, Wednesday, April 16.

## 2012-11-09 ENCOUNTER — Ambulatory Visit
Admission: RE | Admit: 2012-11-09 | Discharge: 2012-11-09 | Disposition: A | Discharge status: Home / Self Care | Payer: 59 | Source: Ambulatory Visit | Attending: Radiation Oncology | Admitting: Radiation Oncology

## 2012-11-10 ENCOUNTER — Ambulatory Visit
Admission: RE | Admit: 2012-11-10 | Discharge: 2012-11-10 | Disposition: A | Discharge status: Home / Self Care | Payer: 59 | Source: Ambulatory Visit | Attending: Radiation Oncology | Admitting: Radiation Oncology

## 2012-11-10 ENCOUNTER — Telehealth: Payer: Self-pay | Admitting: Dietician

## 2012-11-13 ENCOUNTER — Encounter: Payer: Self-pay | Admitting: Radiation Oncology

## 2012-11-13 ENCOUNTER — Ambulatory Visit
Admission: RE | Admit: 2012-11-13 | Discharge: 2012-11-13 | Disposition: A | Discharge status: Home / Self Care | Payer: 59 | Source: Ambulatory Visit | Attending: Radiation Oncology | Admitting: Radiation Oncology

## 2012-11-13 VITALS — BP 155/83 | HR 76 | Temp 97.7°F | Resp 20 | Wt 295.9 lb

## 2012-11-13 DIAGNOSIS — C7952 Secondary malignant neoplasm of bone marrow: Secondary | ICD-10-CM

## 2012-11-13 NOTE — Progress Notes (Signed)
Patient here weekly rad txs, t-8-L-1 spine . 13/15 txs completed, no c/o pain at present, oxycodone and oxy Ir helps, no nausea at present, wheezes at night stated, difficult to sleep,sitting up in chair,has congestive cough dry non productive, takes musinex 1-2x day and allegra at night, 2:45 PM

## 2012-11-14 ENCOUNTER — Ambulatory Visit
Admission: RE | Admit: 2012-11-14 | Discharge: 2012-11-14 | Disposition: A | Discharge status: Home / Self Care | Payer: 59 | Source: Ambulatory Visit | Attending: Radiation Oncology | Admitting: Radiation Oncology

## 2012-11-14 NOTE — Progress Notes (Signed)
   Weekly Management NoteOutpatient Current Dose:  32.5 Gy  Projected Dose: 37.5 Gy   Narrative:  The patient presents for routine under treatment assessment.  CBCT/MVCT images/Port film x-rays were reviewed.  The chart was checked. Pain  Much better. Nausea improved. Feels much better overall. Still wheezy, esp at night.  Physical Findings:  weight is 295 lb 14.4 oz (134.219 kg). His oral temperature is 97.7 F (36.5 C). His blood pressure is 155/83 and his pulse is 76. His respiration is 20.  NAD, sitting in a chair.  Impression:  The patient is tolerating radiotherapy.  Plan:  Continue radiotherapy as planned. Rec'd he talk to PCP about wheezing, respiratory symptoms. F/u with me in 1 mo. Sees [facility] for systemic therapy to start.  ________________________________   Lonie Peak, M.D.

## 2012-11-15 ENCOUNTER — Encounter: Payer: Self-pay | Admitting: Radiation Oncology

## 2012-11-15 ENCOUNTER — Ambulatory Visit
Admission: RE | Admit: 2012-11-15 | Discharge: 2012-11-15 | Disposition: A | Discharge status: Home / Self Care | Payer: 59 | Source: Ambulatory Visit | Attending: Radiation Oncology | Admitting: Radiation Oncology

## 2012-11-19 NOTE — Progress Notes (Signed)
  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  By completion, nausea and pain were much improved.  C/o some wheezing - not characteristic of a side effect from RT - for which I recommended he discuss with his PCP.  Plan: The patient has completed radiation treatment. The patient will return to radiation oncology clinic for routine followup in one month. I advised them to call or return sooner if they have any questions or concerns related to their recovery or treatment.  -----------------------------------  Lonie Peak, MD

## 2012-12-15 ENCOUNTER — Encounter: Payer: Self-pay | Admitting: Radiation Oncology

## 2012-12-19 ENCOUNTER — Ambulatory Visit
Admission: RE | Admit: 2012-12-19 | Discharge: 2012-12-19 | Disposition: A | Discharge status: Home / Self Care | Payer: 59 | Source: Ambulatory Visit | Attending: Radiation Oncology | Admitting: Radiation Oncology

## 2012-12-19 ENCOUNTER — Encounter: Payer: Self-pay | Admitting: Radiation Oncology

## 2012-12-19 VITALS — BP 143/82 | HR 88 | Temp 98.3°F | Ht 75.0 in | Wt 276.0 lb

## 2012-12-19 DIAGNOSIS — C7952 Secondary malignant neoplasm of bone marrow: Secondary | ICD-10-CM

## 2012-12-19 DIAGNOSIS — C7951 Secondary malignant neoplasm of bone: Secondary | ICD-10-CM

## 2012-12-19 HISTORY — DX: Personal history of irradiation: Z92.3

## 2012-12-19 MED ORDER — PROCHLORPERAZINE MALEATE 10 MG PO TABS: 10.0000 mg | ORAL_TABLET | Freq: Four times a day (QID) | ORAL | Status: AC | PRN

## 2012-12-19 MED ORDER — OXYCODONE HCL 10 MG PO TB12: 10.0000 mg | ORAL_TABLET | Freq: Two times a day (BID) | ORAL | Status: AC

## 2012-12-19 NOTE — Progress Notes (Signed)
Mr. Ludvigsen here for assessment following radiation to his T 8 - L 1 region.  Today he c/o  level 3 pain  in the right hip and side region.  He states pain is at it's worse when he initially lies down at night, but notes decrease in pain when he lies on the affected side.  He continuesto have numbness in the left lateral thigh.   He also continues to have a poor appetite.  Unable to view past weights in epic.   VSS.

## 2012-12-19 NOTE — Progress Notes (Signed)
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  He is doing quite well. He reports that his pain level is 3/10. It has improved quite a bit since receiving radiotherapy to his spine. He reports that his wheezing has improved substantially. He saw his primary Dr. and this is felt to be an allergy. He started some treatment for this with alleviation. His appetite is poor. He has lost some weight. His wife is quite knowledgeable about nutrition.    Has plans to start systemic therapy in the near future, it is waiting for the drug to arrive. They believe it may be Votrient.      ALLERGIES:  has No Known Allergies.  Meds: Current Outpatient Prescriptions  Medication Sig Dispense Refill  . metFORMIN (GLUCOPHAGE) 850 MG tablet Take 850 mg by mouth 3 (three) times daily.      . ondansetron (ZOFRAN) 8 MG tablet Take 1 tablet (8 mg total) by mouth every 8 (eight) hours as needed for nausea.  20 tablet  5  . oxycodone (OXY-IR) 5 MG capsule Take 5 mg by mouth every 4 (four) hours as needed.      Marland Kitchen oxyCODONE (OXYCONTIN) 10 MG 12 hr tablet Take 1 tablet (10 mg total) by mouth every 12 (twelve) hours.  60 tablet  0  . polyethylene glycol (MIRALAX / GLYCOLAX) packet Take 17 g by mouth daily.      . prochlorperazine (COMPAZINE) 10 MG tablet Take 1 tablet (10 mg total) by mouth every 6 (six) hours as needed.  60 tablet  0  . traMADol (ULTRAM) 50 MG tablet Take 1 tablet (50 mg total) by mouth as needed for pain. Take 50mg  po Every 3-6 hours as needed  90 tablet  0  . acetaminophen (TYLENOL) 500 MG tablet  Take 500 mg by mouth every 6 (six) hours as needed for pain.      . fexofenadine (ALLEGRA) 30 MG tablet Take 30 mg by mouth at bedtime.       No current facility-administered medications for this encounter.    Physical Findings: The patient is in no acute distress. Patient is alert and oriented.  height is 6\' 3"  (1.905 m) and weight is 276 lb (125.193 kg). His temperature is 98.3 F (36.8 C). His blood pressure is 143/82 and his pulse is 88. .    General: Alert and oriented, in no acute distress Skin: Minor residual hyperpigmentation and dryness in the lower T-spine region. Psychiatric: Judgment and insight are intact. Affect is appropriate.   Lab Findings: Lab Results  Component Value Date   HGB 14.6 12/29/2006    Radiographic Findings: No results found.  Impression/Plan:  He is doing very well. He had a great palliative response to radiotherapy. I refilled his OxyContin and Compazine today. I will see him back on an as-needed basis. He knows to call if they have any issues that I can help with. In the meantime he will continue to follow with medical oncology at [facility]. I gave them the contact information for the nutritionist, to contact her for further feedback  regarding the patient's weight loss. The patient still has a high BMI, but we would like to slow down the pace of his weight loss.  I spent 20 minutes minutes face to face with the patient and more than 50% of that time was spent in counseling and/or coordination of care. _____________________________________   Lonie Peak, MD

## 2013-03-10 HISTORY — PX: OTHER SURGICAL HISTORY: SHX169

## 2013-07-02 ENCOUNTER — Other Ambulatory Visit: Payer: Self-pay | Admitting: Radiation Oncology

## 2015-08-07 MED FILL — BENZONATATE 100 MG CAPSULE: 100 | 10 days supply | Qty: 30 | Fill #1

## 2015-08-11 MED FILL — OxyCONTIN 10 MG T12A: 10 | 30 days supply | Qty: 60 | Fill #0

## 2015-08-25 MED FILL — DEXAMETHASONE 4 MG TABLET: 4 | 4 days supply | Qty: 4 | Fill #0

## 2015-09-03 MED FILL — NYSTATIN 100,000 UNIT/GM PO: 100000 | 20 days supply | Qty: 60 | Fill #0

## 2015-09-04 ENCOUNTER — Ambulatory Visit
Admission: RE | Admit: 2015-09-04 | Discharge: 2015-09-04 | Disposition: A | Discharge status: Home / Self Care | Payer: Medicare Other | Source: Ambulatory Visit

## 2015-09-04 ENCOUNTER — Telehealth: Payer: Self-pay | Admitting: *Deleted

## 2015-09-04 ENCOUNTER — Ambulatory Visit
Admission: RE | Admit: 2015-09-04 | Discharge: 2015-09-04 | Disposition: A | Discharge status: Home / Self Care | Payer: Medicare Other | Source: Ambulatory Visit | Attending: Radiation Oncology | Admitting: Radiation Oncology

## 2015-09-04 ENCOUNTER — Encounter: Payer: Self-pay | Admitting: Radiation Oncology

## 2015-09-04 VITALS — BP 134/79 | HR 82 | Temp 97.8°F | Resp 16 | Ht 75.0 in | Wt 271.6 lb

## 2015-09-04 DIAGNOSIS — C7951 Secondary malignant neoplasm of bone: Secondary | ICD-10-CM | POA: Insufficient documentation

## 2015-09-04 DIAGNOSIS — Z85528 Personal history of other malignant neoplasm of kidney: Secondary | ICD-10-CM | POA: Diagnosis present

## 2015-09-04 DIAGNOSIS — Z923 Personal history of irradiation: Secondary | ICD-10-CM | POA: Insufficient documentation

## 2015-09-04 DIAGNOSIS — R9721 Rising PSA following treatment for malignant neoplasm of prostate: Secondary | ICD-10-CM | POA: Insufficient documentation

## 2015-09-04 DIAGNOSIS — E875 Hyperkalemia: Secondary | ICD-10-CM | POA: Diagnosis not present

## 2015-09-04 DIAGNOSIS — Z905 Acquired absence of kidney: Secondary | ICD-10-CM | POA: Insufficient documentation

## 2015-09-04 DIAGNOSIS — N2889 Other specified disorders of kidney and ureter: Secondary | ICD-10-CM | POA: Insufficient documentation

## 2015-09-04 DIAGNOSIS — C641 Malignant neoplasm of right kidney, except renal pelvis: Secondary | ICD-10-CM

## 2015-09-04 DIAGNOSIS — C649 Malignant neoplasm of unspecified kidney, except renal pelvis: Secondary | ICD-10-CM | POA: Insufficient documentation

## 2015-09-04 DIAGNOSIS — R918 Other nonspecific abnormal finding of lung field: Secondary | ICD-10-CM | POA: Insufficient documentation

## 2015-09-04 DIAGNOSIS — C61 Malignant neoplasm of prostate: Secondary | ICD-10-CM | POA: Diagnosis present

## 2015-09-04 NOTE — Progress Notes (Signed)
Radiation Oncology         [phone] ________________________________  Initial Outpatient Consultation  Name: Joshua Black MRN: [id number]  Date: 09/04/2015  DOB: [birth year]  CC:No primary care provider on file.  Lyndon Code, MD   REFERRING PHYSICIAN: Lyndon Code, MD  DIAGNOSIS: Prostate cancer with a history of metastatic renal cell carcinoma to the spine.  HISTORY OF PRESENT ILLNESS::Joshua Black is a 70 y.o. male  who seen out courtesy of Dr. Bridgett Larsson for consideration for palliative radiation therapy as part of the management of patient's prostate cancer. Patient also has a concurrent diagnosis of stage IV renal cell carcinoma and has been involved in no active treatment for his prostate cancer in light of the more pressing problem of renal cell carcinoma.  He presented last Friday at [facility] with 800 cc urine by bladder scan after voiding. He had a foley catheter for a week which was taken out yesterday. He has an IPSS score of 12. He denies bowel issues. He has some occasional nausea and vomiting. He has pain in his abdomen and lower back. He is taking Oxycontin 10 mg BID and Oxycontin 5 mg for breakthrough. He is here today with his wife. He has a history of Gleason 7 prostate cancer.  He had metastatic renal cell carcinoma to the spine that was treated in 2014, when he also took voltriant for 2.5 years. He has only taken Flomax and had his PSA monitored. His most recent PSA was about 10 in August 2016.   PREVIOUS RADIATION THERAPY: Yes. He had metastatic renal cell carcinoma to the spine that was treated 2 years and 10 months ago. He completed radiation 10/26/2012-11/15/2012 to T8-L1 with 37.5 Gy in 15 fractions. (Dr. Isidore Moos).The patient also completed palliative radiation therapy for painful osseous metastasis involving T9 and T12. The patient was treated recently and received 25 gray in 5 fractions using the [facility] unit at [facility]:  has a past medical history  of Renal cell cancer ([place]); H/O prostate cancer; Bone metastases ([place]) (10/14/12 MRI); Spinal cord compression [facility]) (?); Left renal mass (10/06/12 CT Scan Abodoment); Childhood asthma; Diabetes ([place]) (since 2006); Pulmonary nodules (10/06/12 CT SCAN); Hyperkalemia (10/12/12 dictation); Diabetes ([place]); Morbid obesity ([place]); and S/P radiation therapy ( 10/26/2012-11/15/2012).    PAST SURGICAL HISTORY: Past Surgical History  Procedure Laterality Date  . Core biopsy and fine needle aspiration N/A 03/10/13    Bone Lesion - L1(A&B)  No Malignant Cells  . Right nephrectomy Right     with IVC Tumor Thrombectomy and a Retroperitoneal Lymph Node Dissection - Stage T3b: 0/8 negative Nodes  . Facial reconstructive surgery      Dog Bite  . Roator cuff repair Left     FAMILY HISTORY: family history includes Coronary artery disease in his father; Prostate cancer in his brother.  SOCIAL HISTORY:  reports that he has never smoked. He does not have any smokeless tobacco history on file. He reports that he does not drink alcohol.  ALLERGIES: Review of patient's allergies indicates no known allergies.  MEDICATIONS:  Current Outpatient Prescriptions  Medication Sig Dispense Refill  . albuterol (PROVENTIL HFA;VENTOLIN HFA) 108 (90 Base) MCG/ACT inhaler Inhale into the lungs.    Marland Kitchen b complex vitamins capsule Take by mouth.    . calcium-vitamin D ([employer] OYSTER SHELL CALCIUM-VIT D) 500-200 MG-UNIT tablet Take by mouth.    . docusate sodium (STOOL SOFTENER) 100 MG capsule Take 100 mg by mouth.    Marland Kitchen  Ginseng 100 MG CAPS Take 1,000 mg by mouth.    Marland Kitchen lisinopril (PRINIVIL,ZESTRIL) 40 MG tablet Take 40 mg by mouth.    . Loratadine-Pseudoephedrine (LORATADINE-D 12HR PO) Take by mouth.    Marland Kitchen LORazepam (ATIVAN) 0.5 MG tablet Take 0.5 mg by mouth.    . Multiple Vitamins-Minerals (MULTIVITAL PLATINUM SILVER PO) Take by mouth.    . nystatin (MYCOSTATIN) powder     . Omega-3 1000 MG CAPS Take by mouth.    Marland Kitchen omeprazole (PRILOSEC) 20  MG capsule Take 20 mg by mouth.    . ondansetron (ZOFRAN) 8 MG tablet Take 1 tablet (8 mg total) by mouth every 8 (eight) hours as needed for nausea. 20 tablet 5  . oxycodone (OXY-IR) 5 MG capsule Take 5 mg by mouth every 4 (four) hours as needed.    Marland Kitchen oxyCODONE (OXYCONTIN) 10 MG 12 hr tablet Take 1 tablet (10 mg total) by mouth every 12 (twelve) hours. 60 tablet 0  . polyethylene glycol (MIRALAX / GLYCOLAX) packet Take 17 g by mouth daily.    . prochlorperazine (COMPAZINE) 10 MG tablet Take 1 tablet (10 mg total) by mouth every 6 (six) hours as needed. 60 tablet 0  . tamsulosin (FLOMAX) 0.4 MG CAPS capsule Take 0.4 mg by mouth.    Marland Kitchen acetaminophen (TYLENOL) 500 MG tablet Take 500 mg by mouth every 6 (six) hours as needed for pain. Reported on 09/04/2015    . fexofenadine (ALLEGRA) 30 MG tablet Take 30 mg by mouth at bedtime. Reported on 09/04/2015    . traMADol (ULTRAM) 50 MG tablet Take 1 tablet (50 mg total) by mouth as needed for pain. Take 50mg  po Every 3-6 hours as needed (Patient not taking: Reported on 09/04/2015) 90 tablet 0   No current facility-administered medications for this encounter.    REVIEW OF SYSTEMS:  A 15 point review of systems is documented in the electronic medical record. This was obtained by the nursing staff. However, I reviewed this with the patient to discuss relevant findings and make appropriate changes.  Pertinent items are noted in HPI.  He mentions that when he urinates, it takes a while but denies dysuria. After he was catheterized, he had some pain with urination and passed some blood clots. Since it was taken out, he has been urinating, but not as much as he would like to. He wears a pad for urinary incontinence since he had the catheter removed. He mentions that when he wakes up, his legs are swollen, then when he walks around, the swelling get better.   PHYSICAL EXAM:  height is 6\' 3"  (1.905 m) and weight is 271 lb 9.6 oz (123.197 kg). His oral temperature is 97.8 F  (36.6 C). His blood pressure is 134/79 and his pulse is 82. His respiration is 16 and oxygen saturation is 97%.   General: Alert and oriented, in no acute distress HEENT: Head is normocephalic. Neck: Neck is supple, no palpable cervical or supraclavicular lymphadenopathy. Heart: Regular in rate and rhythm with no murmurs, rubs, or gallops. Chest: Clear to auscultation bilaterally, with no rhonchi, wheezes, or rales. Abdomen: Soft, nontender with no rigidity or guarding. Distended and questionable fluid wave on exam. Extremities: No cyanosis. Pitting edema in his lower extremities. Lymphatics: see Neck Exam Psychiatric: Judgment and insight are intact. Affect is appropriate. The patient wished to defer the rectal exam  ECOG = 2  LABORATORY DATA:  Lab Results  Component Value Date   HGB 14.6 12/29/2006  Lab Results  Component Value Date   NA 139 12/27/2006   K 4.4 12/27/2006   CL 106 12/27/2006   CO2 29 12/27/2006   GLUCOSE 113* 12/27/2006   CREATININE 0.99 12/27/2006   CALCIUM 9.2 12/27/2006      RADIOGRAPHY: Reports from [facility] reviewed    IMPRESSION: Mr. Fenters is a 70 yo male with prostate cancer and a history of metastatic renal cell carcinoma to the spine. Per recent CT reports from [facility] the patient has progressive disease from his renal cell carcinoma with progressive lymphadenopathy carcinomatosis and ascites as well as worsening osseous metastasis. The patient has not been on any therapy for his prostate cancer is having increasing urinary symptoms  with the weak stream. Options to consider for his prostate treatment include palliative radiation therapy, hormonal therapy or possibly TURP. Patient does not wish to have a chronic foley catheter as he was very uncomfortable with this most recently.  PLAN: I will refer him back to Dr. Junious Silk to determine if he may be candidate for TURP or hormone therapy. If he is not a candidate for either of these  therapies, the patient may potentially benefit from palliative radiation therapy directed at the prostate region.     ------------------------------------------------  Blair Promise, PhD, MD    This document serves as a record of services personally performed by Gery Pray, MD. It was created on his behalf by Lendon Collar, a trained medical scribe. The creation of this record is based on the scribe's personal observations and the provider's statements to them. This document has been checked and approved by the attending provider.

## 2015-09-04 NOTE — Progress Notes (Signed)
GU Location of Tumor / Histology: prostate cancer  If Prostate Cancer, and PSA is )  Joshua Black presented last Friday with 800 cc urine by bladder scan after voiding.  He then had a foley catheter for a week which was taken out yesterday.  Past/Anticipated interventions by urology, if any: none  Past/Anticipated interventions by medical oncology, if any: yes - votriant for 2.5 years.  Infusions that started in August at [facility].  Weight changes, if any: no  Bowel/Bladder complaints, if any: yes IPSS score of 12.  Denies bowel issues.  Nausea/Vomiting, if any: yes occasional  Pain issues, if any:  Has pain in his abdomen and lower back.  Takes Oxycontin 10 mg BID and oxycontin 5 mg for breakthrough  SAFETY ISSUES: Prior radiation?  10/26/2012-11/15/2012 - T8-L1 / 37.5Gy in 15 fractions, 09/03/15 - T9 and T12: 5 Gy x 5 = 25 Gy at [facility]  Pacemaker/ICD? no  Possible current pregnancy? no  Is the patient on methotrexate? no  Current Complaints / other details:  Patient is here with his wife.  He says that he saw Dr. Rockne Coons and was diagnosed with prostate cancer in 2012.  He has only taken flomax and had his PSA monitored.  BP 134/79 mmHg  Pulse 82  Temp(Src) 97.8 F (36.6 C) (Oral)  Resp 16  Ht 6\' 3"  (1.905 m)  Wt 271 lb 9.6 oz (123.197 kg)  BMI 33.95 kg/m2  SpO2 97%

## 2015-09-05 NOTE — Addendum Note (Signed)
Encounter addended by: Jacqulyn Liner, RN on: 09/05/2015 12:08 PM<BR>     Documentation filed: Charges VN

## 2015-09-08 ENCOUNTER — Telehealth: Payer: Self-pay | Admitting: *Deleted

## 2015-09-08 NOTE — Telephone Encounter (Signed)
Called patient to inform of fu appt. With Dr. Junious Silk on 10-20-15 - arrival time - 1:30 pm, lvm for a return call

## 2015-09-10 ENCOUNTER — Telehealth: Payer: Self-pay | Admitting: Oncology

## 2015-09-10 NOTE — Telephone Encounter (Signed)
Rex Kras, [facility] Nurse Navigator left a message regarding Sunao Fairweather.  She stated that Mr. Vanasten has some questions about why he is not able to have radiation and why he is being referred back to urology.  She is asking for progress notes to be faxed to [facility] from his consult visit and also stated that he should be referred back to Urology at [facility] if possible.  Called her back and left a message requesting a return call.

## 2015-09-15 MED FILL — OxyCONTIN 10 MG T12A: 10 | 30 days supply | Qty: 60 | Fill #0

## 2015-09-18 MED FILL — oxyCODONE HCL 5 MG TABS: 5 | 15 days supply | Qty: 60 | Fill #0

## 2015-09-23 MED FILL — LISINOPRIL 40 MG TABLET: 40 | 90 days supply | Qty: 90 | Fill #1

## 2015-09-23 MED FILL — TAMSULOSIN HCL 0.4 MG CAP: 0.4 | 30 days supply | Qty: 30 | Fill #0

## 2015-09-24 MED FILL — VENTOLIN HFA 90 MCG INHALER: 108 (90 BAS | 25 days supply | Qty: 18 | Fill #0

## 2015-10-15 MED FILL — oxyCODONE HCL 5 MG TABS: 5 | 15 days supply | Qty: 60 | Fill #0

## 2015-10-15 MED FILL — OxyCONTIN 10 MG T12A: 10 | 30 days supply | Qty: 60 | Fill #0

## 2015-10-28 MED FILL — ONDANSETRON HCL 8 MG TABLET: 8 | 15 days supply | Qty: 30 | Fill #1

## 2015-10-28 MED FILL — TAMSULOSIN HCL 0.4 MG CAP: 0.4 | 30 days supply | Qty: 30 | Fill #1

## 2015-11-13 MED FILL — PROCHLORPERAZINE 10 MG TAB: 10 | 11 days supply | Qty: 45 | Fill #0

## 2015-11-14 MED FILL — OxyCONTIN 10 MG T12A: 10 | 30 days supply | Qty: 60 | Fill #0

## 2015-11-14 MED FILL — oxyCODONE HCL 5 MG TABS: 5 | 22 days supply | Qty: 90 | Fill #0

## 2015-11-14 MED FILL — VENTOLIN HFA 90 MCG INHALER: 108 (90 BAS | 25 days supply | Qty: 18 | Fill #1

## 2015-11-20 MED FILL — ONDANSETRON HCL 8 MG TABLET: 8 | 15 days supply | Qty: 30 | Fill #2

## 2015-11-24 ENCOUNTER — Telehealth: Payer: Self-pay | Admitting: Oncology

## 2015-11-24 NOTE — Telephone Encounter (Signed)
Left a message for [name], Navigator at [facility] to check on Joshua Black's status.  Requested a return call.

## 2015-11-26 MED FILL — TAMSULOSIN HCL 0.4 MG CAP: 0.4 | 30 days supply | Qty: 30 | Fill #2

## 2015-11-27 ENCOUNTER — Telehealth: Payer: Self-pay | Admitting: Oncology

## 2015-11-27 NOTE — Telephone Encounter (Addendum)
[name], Navigator for [facility] left a message and said Joshua Black is currently taking cabozantinib 40 mg daily and will be returning in a couple weeks for labs.  She said if we had any further questions to call her at [phone].

## 2015-12-02 MED FILL — HYDROmorphone HCL 2 MG TABS: 2 | 3 days supply | Qty: 7 | Fill #0

## 2015-12-04 MED FILL — LORazepam 0.5 MG TABS: 0.5 | 20 days supply | Qty: 60 | Fill #0

## 2015-12-18 MED FILL — ONDANSETRON HCL 8 MG TABLET: 8 | 15 days supply | Qty: 30 | Fill #0

## 2015-12-29 MED FILL — TAMSULOSIN HCL 0.4 MG CAP: 0.4 | 30 days supply | Qty: 30 | Fill #3

## 2015-12-29 MED FILL — LISINOPRIL 40 MG TABLET: 40 | 90 days supply | Qty: 90 | Fill #2

## 2015-12-29 MED FILL — OxyCONTIN 30 MG T12A: 30 | 30 days supply | Qty: 60 | Fill #0

## 2015-12-31 MED FILL — CITALOPRAM HBR 20 MG TABLET: 20 | 30 days supply | Qty: 30 | Fill #0

## 2016-01-06 MED FILL — PROCHLORPERAZINE 10 MG TAB: 10 | 11 days supply | Qty: 45 | Fill #1

## 2016-01-16 ENCOUNTER — Inpatient Hospital Stay (HOSPITAL_COMMUNITY)
Admission: EM | Admit: 2016-01-16 | Discharge: 2016-01-24 | DRG: 194 | Disposition: E | Payer: Medicare Other | Attending: Internal Medicine | Admitting: Internal Medicine

## 2016-01-16 ENCOUNTER — Encounter (HOSPITAL_COMMUNITY): Payer: Self-pay | Admitting: Emergency Medicine

## 2016-01-16 DIAGNOSIS — E1122 Type 2 diabetes mellitus with diabetic chronic kidney disease: Secondary | ICD-10-CM | POA: Diagnosis present

## 2016-01-16 DIAGNOSIS — R112 Nausea with vomiting, unspecified: Secondary | ICD-10-CM | POA: Diagnosis present

## 2016-01-16 DIAGNOSIS — N400 Enlarged prostate without lower urinary tract symptoms: Secondary | ICD-10-CM | POA: Diagnosis present

## 2016-01-16 DIAGNOSIS — J189 Pneumonia, unspecified organism: Principal | ICD-10-CM | POA: Diagnosis present

## 2016-01-16 DIAGNOSIS — Z79891 Long term (current) use of opiate analgesic: Secondary | ICD-10-CM

## 2016-01-16 DIAGNOSIS — C649 Malignant neoplasm of unspecified kidney, except renal pelvis: Secondary | ICD-10-CM | POA: Diagnosis present

## 2016-01-16 DIAGNOSIS — F419 Anxiety disorder, unspecified: Secondary | ICD-10-CM | POA: Diagnosis present

## 2016-01-16 DIAGNOSIS — Z8546 Personal history of malignant neoplasm of prostate: Secondary | ICD-10-CM

## 2016-01-16 DIAGNOSIS — I959 Hypotension, unspecified: Secondary | ICD-10-CM | POA: Diagnosis present

## 2016-01-16 DIAGNOSIS — N189 Chronic kidney disease, unspecified: Secondary | ICD-10-CM | POA: Diagnosis present

## 2016-01-16 DIAGNOSIS — Z923 Personal history of irradiation: Secondary | ICD-10-CM

## 2016-01-16 DIAGNOSIS — I129 Hypertensive chronic kidney disease with stage 1 through stage 4 chronic kidney disease, or unspecified chronic kidney disease: Secondary | ICD-10-CM | POA: Diagnosis present

## 2016-01-16 DIAGNOSIS — E860 Dehydration: Secondary | ICD-10-CM | POA: Diagnosis present

## 2016-01-16 DIAGNOSIS — R630 Anorexia: Secondary | ICD-10-CM | POA: Diagnosis present

## 2016-01-16 DIAGNOSIS — R634 Abnormal weight loss: Secondary | ICD-10-CM | POA: Diagnosis present

## 2016-01-16 DIAGNOSIS — G4700 Insomnia, unspecified: Secondary | ICD-10-CM | POA: Diagnosis present

## 2016-01-16 DIAGNOSIS — J45909 Unspecified asthma, uncomplicated: Secondary | ICD-10-CM | POA: Diagnosis present

## 2016-01-16 DIAGNOSIS — I469 Cardiac arrest, cause unspecified: Secondary | ICD-10-CM | POA: Diagnosis not present

## 2016-01-16 DIAGNOSIS — K219 Gastro-esophageal reflux disease without esophagitis: Secondary | ICD-10-CM | POA: Diagnosis present

## 2016-01-16 DIAGNOSIS — C7951 Secondary malignant neoplasm of bone: Secondary | ICD-10-CM | POA: Diagnosis present

## 2016-01-16 DIAGNOSIS — Z8042 Family history of malignant neoplasm of prostate: Secondary | ICD-10-CM

## 2016-01-16 DIAGNOSIS — N179 Acute kidney failure, unspecified: Secondary | ICD-10-CM | POA: Diagnosis present

## 2016-01-16 DIAGNOSIS — Z79899 Other long term (current) drug therapy: Secondary | ICD-10-CM

## 2016-01-16 DIAGNOSIS — R0902 Hypoxemia: Secondary | ICD-10-CM | POA: Diagnosis present

## 2016-01-16 DIAGNOSIS — R7989 Other specified abnormal findings of blood chemistry: Secondary | ICD-10-CM | POA: Diagnosis present

## 2016-01-16 DIAGNOSIS — Z8249 Family history of ischemic heart disease and other diseases of the circulatory system: Secondary | ICD-10-CM

## 2016-01-16 DIAGNOSIS — R531 Weakness: Secondary | ICD-10-CM | POA: Diagnosis not present

## 2016-01-16 DIAGNOSIS — E8809 Other disorders of plasma-protein metabolism, not elsewhere classified: Secondary | ICD-10-CM | POA: Diagnosis present

## 2016-01-16 NOTE — ED Notes (Signed)
Pt c/o weakness and N/V x 1 day. Denies diarrhea. Pt takes oral chemo.

## 2016-01-16 NOTE — ED Provider Notes (Signed)
CSN: [id number]     Arrival date & time 01/18/2016  2111 History  By signing my name below, I, [facility], attest that this documentation has been prepared under the direction and in the presence of Deaundra Kutzer, MD. Electronically Signed: Virgel Bouquet, ED Scribe. 01-19-16. 2:59 AM.   Chief Complaint  Patient presents with  . Emesis  . Weakness    Patient is a 70 y.o. male presenting with vomiting. The history is provided by the patient and the spouse. No language interpreter was used.  Emesis Severity:  Mild Timing:  Rare Quality:  Stomach contents Progression:  Unchanged Chronicity:  New Recent urination:  Normal Context: not post-tussive   Relieved by:  Nothing Worsened by:  Nothing tried Ineffective treatments:  None tried Associated symptoms: chills and cough   Associated symptoms: no abdominal pain, no diarrhea, no fever, no headaches, no myalgias and no sore throat   Risk factors: no alcohol use    HPI Comments: Joshua Black is a 70 y.o. male with a hx of prostate cancer, DM, asthma, renal cell cancer, and bone metastases who presents to the Emergency Department complaining of intermittent, moderate vomiting onset yesterday. Wife states that the pt woke with nausea, vomiting yesterday morning followed by chills later that day. She reports that they checked his CBG at home and that it was in the 16s which is low for him (normally in the 120s). He reports associated weakness, appetite loss, and sleep disturbance. He has been eating less. Per wife, he is currently on oral chemotherapy. Pt notes that he was admitted to [facility] last month where they found a tick on him which was removed but did not receive antibiotic treatment. He takes OxyContin BID, oxycodone for breakthrough pain, and Dilaudid as needed. He has taken OxyContin and oxycodone once each today. She notes that he was recently prescribed Celexa for anxiety. Denies taking Dilaudid today. Denies recent travel.  Denies fever, diarrhea, difficulty urinating, urinary incontinence, cough, sore throat, new rashes, or any other symptoms currently.  Past Medical History  Diagnosis Date  . H/O prostate cancer   . Spinal cord compression ([place]) ?    ?  . Left renal mass 10/06/12 CT Scan Abodoment    "Enlarging Exophytic  . Childhood asthma   . Diabetes ([place]) since 2006  . Pulmonary nodules 10/06/12 CT SCAN    Stable  . Hyperkalemia 10/12/12 dictation    "mild"  . Diabetes ([place])   . Morbid obesity ([place])   . S/P radiation therapy  10/26/2012-11/15/2012     T8-L1 / 37.5Gy in 15 fractions  . Renal cell cancer ([place])   . Bone metastases ([place]) 10/14/12 MRI     MRI DefinitiveOsseous Lesions - C3, C4, C6, C7, T1, T2, T9, T10, T11, T12 and L1 with Epidural and Right Neural Foraminal Invasion from T8-T12   Past Surgical History  Procedure Laterality Date  . Core biopsy and fine needle aspiration N/A 03/10/13    Bone Lesion - L1(A&B)  No Malignant Cells  . Right nephrectomy Right     with IVC Tumor Thrombectomy and a Retroperitoneal Lymph Node Dissection - Stage T3b: 0/8 negative Nodes  . Facial reconstructive surgery      Dog Bite  . Roator cuff repair Left    Family History  Problem Relation Age of Onset  . Coronary artery disease Father   . Prostate cancer Brother   . Cancer  [place]  Social History  Substance Use Topics  . Smoking status: Never Smoker   . Smokeless tobacco: None  . Alcohol Use: No    Review of Systems  Constitutional: Positive for chills, appetite change and fatigue. Negative for fever.  HENT: Negative for congestion, facial swelling, hearing loss and sore throat.   Respiratory: Positive for cough. Negative for wheezing.   Cardiovascular: Negative for chest pain, palpitations and leg swelling.  Gastrointestinal: Positive for nausea and vomiting. Negative for abdominal pain, diarrhea and constipation.  Genitourinary: Negative for dysuria and difficulty  urinating.  Musculoskeletal: Negative for myalgias.  Skin: Negative for rash.  Neurological: Positive for weakness. Negative for dizziness and headaches.  Psychiatric/Behavioral: Positive for sleep disturbance.  All other systems reviewed and are negative.     Allergies  Review of patient's allergies indicates no known allergies.  Home Medications   Prior to Admission medications   Medication Sig Start Date End Date Taking? Authorizing Provider  b complex vitamins capsule Take 1 capsule by mouth daily.    Yes Historical Provider, MD  CABOMETYX 40 MG TABS Take 40 mg by mouth daily.  12/29/15  Yes Historical Provider, MD  calcium-vitamin D ([employer] OYSTER SHELL CALCIUM-VIT D) 500-200 MG-UNIT tablet Take by mouth.   Yes Historical Provider, MD  citalopram (CELEXA) 20 MG tablet Take 10 mg by mouth daily.  12/31/15  Yes Historical Provider, MD  docusate sodium (STOOL SOFTENER) 100 MG capsule Take 100 mg by mouth 2 (two) times daily.    Yes Historical Provider, MD  Ginseng 100 MG CAPS Take 1,000 mg by mouth daily.    Yes Historical Provider, MD  HYDROmorphone (DILAUDID) 2 MG tablet Take 2 mg by mouth every 4 (four) hours as needed for severe pain.   Yes Historical Provider, MD  lisinopril (PRINIVIL,ZESTRIL) 40 MG tablet Take 40 mg by mouth daily.  06/23/15 06/22/16 Yes Historical Provider, MD  LORazepam (ATIVAN) 0.5 MG tablet Take 0.5 mg by mouth every 6 (six) hours as needed for anxiety or sleep.  12/04/15  Yes Historical Provider, MD  Multiple Vitamins-Minerals (MULTIVITAL PLATINUM SILVER PO) Take 1 tablet by mouth daily.    Yes Historical Provider, MD  Omega-3 1000 MG CAPS Take 1 capsule by mouth daily.    Yes Historical Provider, MD  omeprazole (PRILOSEC) 20 MG capsule Take 20 mg by mouth daily.    Yes Historical Provider, MD  ondansetron (ZOFRAN) 8 MG tablet Take 1 tablet (8 mg total) by mouth every 8 (eight) hours as needed for nausea. 10/24/12  Yes Eppie Gibson, MD  oxycodone (OXY-IR) 5 MG  capsule Take 5 mg by mouth every 4 (four) hours as needed for pain.    Yes Historical Provider, MD  oxyCODONE (OXYCONTIN) 10 MG 12 hr tablet Take 1 tablet (10 mg total) by mouth every 12 (twelve) hours. 12/19/12  Yes Eppie Gibson, MD  OXYCONTIN 30 MG 12 hr tablet Take 30 mg by mouth 2 (two) times daily.  12/29/15  Yes Historical Provider, MD  polyethylene glycol (MIRALAX / GLYCOLAX) packet Take 17 g by mouth daily as needed for mild constipation or moderate constipation.    Yes Historical Provider, MD  prochlorperazine (COMPAZINE) 10 MG tablet Take 1 tablet (10 mg total) by mouth every 6 (six) hours as needed. Patient taking differently: Take 10 mg by mouth every 6 (six) hours as needed for nausea or vomiting.  12/19/12  Yes Eppie Gibson, MD  tamsulosin (FLOMAX) 0.4 MG CAPS capsule Take 0.4 mg by mouth  daily after supper.  08/29/15  Yes Historical Provider, MD  acetaminophen (TYLENOL) 500 MG tablet Take 500 mg by mouth every 6 (six) hours as needed for pain. Reported on 09/04/2015    Historical Provider, MD  albuterol (PROVENTIL HFA;VENTOLIN HFA) 108 (90 Base) MCG/ACT inhaler Inhale 2 puffs into the lungs every 6 (six) hours as needed for wheezing or shortness of breath.     Historical Provider, MD  traMADol (ULTRAM) 50 MG tablet Take 1 tablet (50 mg total) by mouth as needed for pain. Take 50mg  po Every 3-6 hours as needed Patient not taking: Reported on 09/04/2015 10/30/12   Eppie Gibson, MD   BP 97/70 mmHg  Pulse 79  Temp(Src) 97.5 F (36.4 C) (Oral)  Resp 18  SpO2 90% Physical Exam  Constitutional: He is oriented to person, place, and time. He appears well-developed and well-nourished. No distress.  HENT:  Head: Normocephalic and atraumatic.  Right Ear: Tympanic membrane normal.  Left Ear: Tympanic membrane normal.  Mouth/Throat: Oropharynx is clear and moist and mucous membranes are normal. No oropharyngeal exudate.  Moist mucus membranes  Eyes: EOM are normal. Pupils are equal, round, and  reactive to light.  PERRL.  Neck: Normal range of motion. Neck supple.  No lymph nodes in the neck.  Cardiovascular: Normal rate, regular rhythm and intact distal pulses.   Regular rate and rhythm.  Pulmonary/Chest: Effort normal and breath sounds normal. No stridor. No respiratory distress. He has no wheezes. He has no rales.  Lungs CTA bilaterally.  Abdominal: Soft. Bowel sounds are normal. He exhibits no distension. There is no tenderness. There is no rebound and no guarding.  Musculoskeletal: Normal range of motion. He exhibits no edema or tenderness.  Lymphadenopathy:    He has no cervical adenopathy.  Neurological: He is alert and oriented to person, place, and time. He has normal reflexes.  Skin: Skin is warm and dry. He is not diaphoretic.  Psychiatric: He has a normal mood and affect. His behavior is normal.  Nursing note and vitals reviewed.   ED Course  Procedures (including critical care time)  DIAGNOSTIC STUDIES: Oxygen Saturation is 93% on 2 L/min, low by my interpretation.    COORDINATION OF CARE: 12:02 AM Will order Zofran, labs, and chest x-ray. Discussed treatment plan with pt at bedside and pt agreed to plan.  2:58 AM Consulted with hospitalist who agreed to admit the pt. Will admit pt to [facility] in-patient.  Labs Review Labs Reviewed  CBC WITH DIFFERENTIAL/PLATELET - Abnormal; Notable for the following:    Hemoglobin 11.7 (*)    HCT 35.6 (*)    RDW 23.8 (*)    Platelets 115 (*)    All other components within normal limits  COMPREHENSIVE METABOLIC PANEL - Abnormal; Notable for the following:    BUN 33 (*)    Creatinine, Ser 1.63 (*)    Calcium 8.0 (*)    Albumin 2.7 (*)    AST 44 (*)    GFR calc non Af Amer 41 (*)    GFR calc Af Amer 48 (*)    All other components within normal limits  I-STAT CHEM 8, ED - Abnormal; Notable for the following:    Chloride 100 (*)    BUN 33 (*)    Creatinine, Ser 1.50 (*)    Hemoglobin 12.6 (*)    HCT 37.0 (*)     All other components within normal limits  URINALYSIS, ROUTINE W REFLEX MICROSCOPIC (NOT AT [facility])  I-STAT  TROPOININ, ED    Imaging Review Dg Chest 2 View  01-22-2016  CLINICAL DATA:  70 year old male with vomiting and chills. EXAM: CHEST  2 VIEW COMPARISON:  Chest CT dated 10/06/2012 FINDINGS: There is a confluent area of airspace opacity in the right lung base/right lower lobe most concerning for pneumonia. A small right pleural effusion is also noted. The left lung is clear. There is no pneumothorax. The cardiac silhouette is within normal limits. No acute osseous pathology. IMPRESSION: Right lower lobe density concerning for pneumonia. Correlation with clinical exam and follow-up to resolution recommended. Electronically Signed   By: Anner Crete M.D.   On: 2016-01-22 01:32   I have personally reviewed and evaluated these images and lab results as part of my medical decision-making.   EKG Interpretation   Date/Time:  January 22, 2016 00:59:52 EDT Ventricular Rate:  74 PR Interval:    QRS Duration: 90 QT Interval:  428 QTC Calculation: 475 R Axis:   -43 Text Interpretation:  Sinus rhythm Left anterior fascicular block Left  ventricular hypertrophy Confirmed by [facility]  MD, Ata Pecha ([id number]) on  01/22/16 2:33:46 AM      MDM   Final diagnoses:  None   Filed Vitals:   January 22, 2016 0200 01-22-16 0427  BP: 97/70 137/91  Pulse: 79 67  Temp:  98.6 F (37 C)  Resp: 18 14   Results for orders placed or performed during the hospital encounter of 01/06/2016  CBC with Differential/Platelet  Result Value Ref Range   WBC 7.3 4.0 - 10.5 K/uL   RBC 4.31 4.22 - 5.81 MIL/uL   Hemoglobin 11.7 (L) 13.0 - 17.0 g/dL   HCT 35.6 (L) 39.0 - 52.0 %   MCV 82.6 78.0 - 100.0 fL   MCH 27.1 26.0 - 34.0 pg   MCHC 32.9 30.0 - 36.0 g/dL   RDW 23.8 (H) 11.5 - 15.5 %   Platelets 115 (L) 150 - 400 K/uL   Neutrophils Relative % 81 %   Neutro Abs 5.9 1.7 - 7.7 K/uL   Lymphocytes Relative 14 %    Lymphs Abs 1.0 0.7 - 4.0 K/uL   Monocytes Relative 5 %   Monocytes Absolute 0.4 0.1 - 1.0 K/uL   Eosinophils Relative 0 %   Eosinophils Absolute 0.0 0.0 - 0.7 K/uL   Basophils Relative 0 %   Basophils Absolute 0.0 0.0 - 0.1 K/uL   WBC Morphology INCREASED BANDS (>20% BANDS)   Comprehensive metabolic panel  Result Value Ref Range   Sodium 137 135 - 145 mmol/L   Potassium 4.6 3.5 - 5.1 mmol/L   Chloride 102 101 - 111 mmol/L   CO2 27 22 - 32 mmol/L   Glucose, Bld 80 65 - 99 mg/dL   BUN 33 (H) 6 - 20 mg/dL   Creatinine, Ser 1.63 (H) 0.61 - 1.24 mg/dL   Calcium 8.0 (L) 8.9 - 10.3 mg/dL   Total Protein 7.0 6.5 - 8.1 g/dL   Albumin 2.7 (L) 3.5 - 5.0 g/dL   AST 44 (H) 15 - 41 U/L   ALT 36 17 - 63 U/L   Alkaline Phosphatase 90 38 - 126 U/L   Total Bilirubin 0.7 0.3 - 1.2 mg/dL   GFR calc non Af Amer 41 (L) >60 mL/min   GFR calc Af Amer 48 (L) >60 mL/min   Anion gap 8 5 - 15  Urinalysis, Routine w reflex microscopic (not at [facility])  Result Value Ref Range   Color, Urine ORANGE (  A) YELLOW   APPearance CLOUDY (A) CLEAR   Specific Gravity, Urine 1.031 (H) 1.005 - 1.030   pH 5.5 5.0 - 8.0   Glucose, UA NEGATIVE NEGATIVE mg/dL   Hgb urine dipstick NEGATIVE NEGATIVE   Bilirubin Urine MODERATE (A) NEGATIVE   Ketones, ur NEGATIVE NEGATIVE mg/dL   Protein, ur 30 (A) NEGATIVE mg/dL   Nitrite NEGATIVE NEGATIVE   Leukocytes, UA TRACE (A) NEGATIVE  Urine microscopic-add on  Result Value Ref Range   Squamous Epithelial / LPF NONE SEEN NONE SEEN   WBC, UA 0-5 0 - 5 WBC/hpf   RBC / HPF NONE SEEN 0 - 5 RBC/hpf   Bacteria, UA RARE (A) NONE SEEN   Casts HYALINE CASTS (A) NEGATIVE   Urine-Other MUCOUS PRESENT   I-Stat Chem 8, ED  Result Value Ref Range   Sodium 138 135 - 145 mmol/L   Potassium 4.5 3.5 - 5.1 mmol/L   Chloride 100 (L) 101 - 111 mmol/L   BUN 33 (H) 6 - 20 mg/dL   Creatinine, Ser 1.50 (H) 0.61 - 1.24 mg/dL   Glucose, Bld 72 65 - 99 mg/dL   Calcium, Ion 1.13 1.13 - 1.30 mmol/L    TCO2 28 0 - 100 mmol/L   Hemoglobin 12.6 (L) 13.0 - 17.0 g/dL   HCT 37.0 (L) 39.0 - 52.0 %  I-stat troponin, ED  Result Value Ref Range   Troponin i, poc 0.02 0.00 - 0.08 ng/mL   Comment 3           Dg Chest 2 View  13-Feb-2016  CLINICAL DATA:  70 year old male with vomiting and chills. EXAM: CHEST  2 VIEW COMPARISON:  Chest CT dated 10/06/2012 FINDINGS: There is a confluent area of airspace opacity in the right lung base/right lower lobe most concerning for pneumonia. A small right pleural effusion is also noted. The left lung is clear. There is no pneumothorax. The cardiac silhouette is within normal limits. No acute osseous pathology. IMPRESSION: Right lower lobe density concerning for pneumonia. Correlation with clinical exam and follow-up to resolution recommended. Electronically Signed   By: Anner Crete M.D.   On: 02/13/2016 01:32    Medications  citalopram (CELEXA) tablet 10 mg (not administered)  HYDROmorphone (DILAUDID) tablet 2 mg (not administered)  LORazepam (ATIVAN) tablet 0.5 mg (not administered)  cabozantinib S-Malate TABS 40 mg (not administered)  oxyCODONE (OXYCONTIN) 12 hr tablet 30 mg (not administered)  l-methylfolate-B6-B12 (METANX) 3-35-2 MG per tablet 1 tablet (not administered)  calcium-vitamin D (OSCAL WITH D) 500-200 MG-UNIT per tablet 1 tablet (not administered)  oxyCODONE (OXYCONTIN) 12 hr tablet 10 mg (10 mg Oral Given 2016/02/13 0443)  pantoprazole (PROTONIX) EC tablet 40 mg (not administered)  tamsulosin (FLOMAX) capsule 0.4 mg (not administered)  omega-3 acid ethyl esters (LOVAZA) capsule 1 g (not administered)  multivitamin with minerals tablet 1 tablet (not administered)  docusate sodium (COLACE) capsule 100 mg (not administered)  polyethylene glycol (MIRALAX / GLYCOLAX) packet 17 g (not administered)  oxyCODONE (Oxy IR/ROXICODONE) immediate release tablet 5 mg (not administered)  enoxaparin (LOVENOX) injection 40 mg (not administered)  ondansetron  (ZOFRAN) tablet 4 mg (not administered)    Or  ondansetron (ZOFRAN) injection 4 mg (not administered)  prochlorperazine (COMPAZINE) injection 10 mg (not administered)  feeding supplement (GLUCERNA SHAKE) (GLUCERNA SHAKE) liquid 237 mL (not administered)  albuterol (PROVENTIL) (2.5 MG/3ML) 0.083% nebulizer solution 2.5 mg (not administered)  acetaminophen (TYLENOL) tablet 650 mg (not administered)    Or  acetaminophen (TYLENOL)  suppository 650 mg (not administered)  HYDROmorphone (DILAUDID) injection 1-2 mg (not administered)  0.9 %  sodium chloride infusion (not administered)  sodium chloride 0.9 % bolus 1,000 mL (0 mLs Intravenous Stopped 02/01/2016 0242)  ondansetron (ZOFRAN) injection 4 mg (4 mg Intravenous Given 2016/02/01 0048)  cefTRIAXone (ROCEPHIN) 1 g in dextrose 5 % 50 mL IVPB (0 g Intravenous Stopped 2016-02-01 0341)  azithromycin (ZITHROMAX) 500 mg in dextrose 5 % 250 mL IVPB (500 mg Intravenous New Bag/Given 2016/02/01 0341)   Will admit to medicine for PNA on chemotherapy.     I personally performed the services described in this documentation, which was scribed in my presence. The recorded information has been reviewed and is accurate.      Veatrice Kells, MD 01-Feb-2016 [phone]

## 2016-01-17 ENCOUNTER — Encounter (HOSPITAL_COMMUNITY): Payer: Self-pay | Admitting: Emergency Medicine

## 2016-01-17 ENCOUNTER — Other Ambulatory Visit: Payer: Self-pay

## 2016-01-17 ENCOUNTER — Observation Stay (HOSPITAL_COMMUNITY): Payer: Medicare Other | Admitting: Certified Registered"

## 2016-01-17 ENCOUNTER — Emergency Department (HOSPITAL_COMMUNITY): Payer: Medicare Other

## 2016-01-17 DIAGNOSIS — G4700 Insomnia, unspecified: Secondary | ICD-10-CM | POA: Diagnosis present

## 2016-01-17 DIAGNOSIS — N189 Chronic kidney disease, unspecified: Secondary | ICD-10-CM | POA: Diagnosis present

## 2016-01-17 DIAGNOSIS — C649 Malignant neoplasm of unspecified kidney, except renal pelvis: Secondary | ICD-10-CM

## 2016-01-17 DIAGNOSIS — R531 Weakness: Secondary | ICD-10-CM | POA: Diagnosis present

## 2016-01-17 DIAGNOSIS — E860 Dehydration: Secondary | ICD-10-CM | POA: Diagnosis present

## 2016-01-17 DIAGNOSIS — Z79891 Long term (current) use of opiate analgesic: Secondary | ICD-10-CM | POA: Diagnosis not present

## 2016-01-17 DIAGNOSIS — Z79899 Other long term (current) drug therapy: Secondary | ICD-10-CM | POA: Diagnosis not present

## 2016-01-17 DIAGNOSIS — J45909 Unspecified asthma, uncomplicated: Secondary | ICD-10-CM | POA: Diagnosis present

## 2016-01-17 DIAGNOSIS — Z8042 Family history of malignant neoplasm of prostate: Secondary | ICD-10-CM | POA: Diagnosis not present

## 2016-01-17 DIAGNOSIS — Z8249 Family history of ischemic heart disease and other diseases of the circulatory system: Secondary | ICD-10-CM | POA: Diagnosis not present

## 2016-01-17 DIAGNOSIS — J189 Pneumonia, unspecified organism: Secondary | ICD-10-CM | POA: Diagnosis present

## 2016-01-17 DIAGNOSIS — I469 Cardiac arrest, cause unspecified: Secondary | ICD-10-CM | POA: Diagnosis not present

## 2016-01-17 DIAGNOSIS — Z8546 Personal history of malignant neoplasm of prostate: Secondary | ICD-10-CM | POA: Diagnosis not present

## 2016-01-17 DIAGNOSIS — Z923 Personal history of irradiation: Secondary | ICD-10-CM | POA: Diagnosis not present

## 2016-01-17 DIAGNOSIS — N179 Acute kidney failure, unspecified: Secondary | ICD-10-CM | POA: Diagnosis present

## 2016-01-17 DIAGNOSIS — N400 Enlarged prostate without lower urinary tract symptoms: Secondary | ICD-10-CM | POA: Diagnosis present

## 2016-01-17 DIAGNOSIS — F419 Anxiety disorder, unspecified: Secondary | ICD-10-CM | POA: Diagnosis present

## 2016-01-17 DIAGNOSIS — R634 Abnormal weight loss: Secondary | ICD-10-CM | POA: Diagnosis present

## 2016-01-17 DIAGNOSIS — C7951 Secondary malignant neoplasm of bone: Secondary | ICD-10-CM | POA: Diagnosis present

## 2016-01-17 DIAGNOSIS — R7989 Other specified abnormal findings of blood chemistry: Secondary | ICD-10-CM | POA: Diagnosis present

## 2016-01-17 DIAGNOSIS — I959 Hypotension, unspecified: Secondary | ICD-10-CM | POA: Diagnosis present

## 2016-01-17 DIAGNOSIS — I129 Hypertensive chronic kidney disease with stage 1 through stage 4 chronic kidney disease, or unspecified chronic kidney disease: Secondary | ICD-10-CM | POA: Diagnosis present

## 2016-01-17 DIAGNOSIS — E8809 Other disorders of plasma-protein metabolism, not elsewhere classified: Secondary | ICD-10-CM | POA: Diagnosis present

## 2016-01-17 DIAGNOSIS — E1122 Type 2 diabetes mellitus with diabetic chronic kidney disease: Secondary | ICD-10-CM | POA: Diagnosis present

## 2016-01-17 DIAGNOSIS — R0902 Hypoxemia: Secondary | ICD-10-CM | POA: Diagnosis present

## 2016-01-17 DIAGNOSIS — K219 Gastro-esophageal reflux disease without esophagitis: Secondary | ICD-10-CM | POA: Diagnosis present

## 2016-01-17 DIAGNOSIS — R112 Nausea with vomiting, unspecified: Secondary | ICD-10-CM | POA: Diagnosis present

## 2016-01-17 DIAGNOSIS — R630 Anorexia: Secondary | ICD-10-CM | POA: Diagnosis present

## 2016-01-17 LAB — CBC WITH DIFFERENTIAL/PLATELET
BASOS PCT: 0 %
Basophils Absolute: 0 10*3/uL (ref 0.0–0.1)
EOS ABS: 0 10*3/uL (ref 0.0–0.7)
EOS PCT: 0 %
HCT: 35.6 % — ABNORMAL LOW (ref 39.0–52.0)
Hemoglobin: 11.7 g/dL — ABNORMAL LOW (ref 13.0–17.0)
LYMPHS ABS: 1 10*3/uL (ref 0.7–4.0)
Lymphocytes Relative: 14 %
MCH: 27.1 pg (ref 26.0–34.0)
MCHC: 32.9 g/dL (ref 30.0–36.0)
MCV: 82.6 fL (ref 78.0–100.0)
MONOS PCT: 5 %
Monocytes Absolute: 0.4 10*3/uL (ref 0.1–1.0)
Neutro Abs: 5.9 10*3/uL (ref 1.7–7.7)
Neutrophils Relative %: 81 %
PLATELETS: 115 10*3/uL — AB (ref 150–400)
RBC: 4.31 MIL/uL (ref 4.22–5.81)
RDW: 23.8 % — AB (ref 11.5–15.5)
WBC Morphology: INCREASED
WBC: 7.3 10*3/uL (ref 4.0–10.5)

## 2016-01-17 LAB — I-STAT CHEM 8, ED
BUN: 33 mg/dL — ABNORMAL HIGH (ref 6–20)
CALCIUM ION: 1.13 mmol/L (ref 1.13–1.30)
CHLORIDE: 100 mmol/L — AB (ref 101–111)
Creatinine, Ser: 1.5 mg/dL — ABNORMAL HIGH (ref 0.61–1.24)
GLUCOSE: 72 mg/dL (ref 65–99)
HCT: 37 % — ABNORMAL LOW (ref 39.0–52.0)
Hemoglobin: 12.6 g/dL — ABNORMAL LOW (ref 13.0–17.0)
Potassium: 4.5 mmol/L (ref 3.5–5.1)
Sodium: 138 mmol/L (ref 135–145)
TCO2: 28 mmol/L (ref 0–100)

## 2016-01-17 LAB — URINALYSIS, ROUTINE W REFLEX MICROSCOPIC
GLUCOSE, UA: NEGATIVE mg/dL
Hgb urine dipstick: NEGATIVE
KETONES UR: NEGATIVE mg/dL
Nitrite: NEGATIVE
PH: 5.5 (ref 5.0–8.0)
Protein, ur: 30 mg/dL — AB
Specific Gravity, Urine: 1.031 — ABNORMAL HIGH (ref 1.005–1.030)

## 2016-01-17 LAB — HEPATIC FUNCTION PANEL
ALT: 29 U/L (ref 17–63)
AST: 33 U/L (ref 15–41)
Albumin: 2.2 g/dL — ABNORMAL LOW (ref 3.5–5.0)
Alkaline Phosphatase: 74 U/L (ref 38–126)
Bilirubin, Direct: 0.2 mg/dL (ref 0.1–0.5)
Indirect Bilirubin: 0.6 mg/dL (ref 0.3–0.9)
TOTAL PROTEIN: 6 g/dL — AB (ref 6.5–8.1)
Total Bilirubin: 0.8 mg/dL (ref 0.3–1.2)

## 2016-01-17 LAB — CBC
HCT: 31.5 % — ABNORMAL LOW (ref 39.0–52.0)
Hemoglobin: 10 g/dL — ABNORMAL LOW (ref 13.0–17.0)
MCH: 26.5 pg (ref 26.0–34.0)
MCHC: 31.7 g/dL (ref 30.0–36.0)
MCV: 83.3 fL (ref 78.0–100.0)
PLATELETS: 81 10*3/uL — AB (ref 150–400)
RBC: 3.78 MIL/uL — ABNORMAL LOW (ref 4.22–5.81)
RDW: 24.1 % — AB (ref 11.5–15.5)
WBC: 5.7 10*3/uL (ref 4.0–10.5)

## 2016-01-17 LAB — COMPREHENSIVE METABOLIC PANEL
ALT: 36 U/L (ref 17–63)
AST: 44 U/L — ABNORMAL HIGH (ref 15–41)
Albumin: 2.7 g/dL — ABNORMAL LOW (ref 3.5–5.0)
Alkaline Phosphatase: 90 U/L (ref 38–126)
Anion gap: 8 (ref 5–15)
BUN: 33 mg/dL — AB (ref 6–20)
CALCIUM: 8 mg/dL — AB (ref 8.9–10.3)
CO2: 27 mmol/L (ref 22–32)
Chloride: 102 mmol/L (ref 101–111)
Creatinine, Ser: 1.63 mg/dL — ABNORMAL HIGH (ref 0.61–1.24)
GFR, EST AFRICAN AMERICAN: 48 mL/min — AB (ref >60)
GFR, EST NON AFRICAN AMERICAN: 41 mL/min — AB (ref >60)
GLUCOSE: 80 mg/dL (ref 65–99)
POTASSIUM: 4.6 mmol/L (ref 3.5–5.1)
SODIUM: 137 mmol/L (ref 135–145)
TOTAL PROTEIN: 7 g/dL (ref 6.5–8.1)
Total Bilirubin: 0.7 mg/dL (ref 0.3–1.2)

## 2016-01-17 LAB — URINE MICROSCOPIC-ADD ON
RBC / HPF: NONE SEEN RBC/hpf (ref 0–5)
Squamous Epithelial / LPF: NONE SEEN

## 2016-01-17 LAB — TSH: TSH: 3.142 u[IU]/mL (ref 0.350–4.500)

## 2016-01-17 LAB — BASIC METABOLIC PANEL
ANION GAP: 4 — AB (ref 5–15)
BUN: 32 mg/dL — AB (ref 6–20)
CALCIUM: 7.3 mg/dL — AB (ref 8.9–10.3)
CO2: 28 mmol/L (ref 22–32)
Chloride: 104 mmol/L (ref 101–111)
Creatinine, Ser: 1.46 mg/dL — ABNORMAL HIGH (ref 0.61–1.24)
GFR calc Af Amer: 54 mL/min — ABNORMAL LOW (ref >60)
GFR, EST NON AFRICAN AMERICAN: 47 mL/min — AB (ref >60)
GLUCOSE: 94 mg/dL (ref 65–99)
Potassium: 4.7 mmol/L (ref 3.5–5.1)
Sodium: 136 mmol/L (ref 135–145)

## 2016-01-17 LAB — I-STAT TROPONIN, ED: TROPONIN I, POC: 0.02 ng/mL (ref 0.00–0.08)

## 2016-01-17 MED ORDER — CALCIUM CARBONATE-VITAMIN D 500-200 MG-UNIT PO TABS
1.0000 | ORAL_TABLET | Freq: Every day | ORAL | Status: DC
  Administered 2016-01-17: 1 via ORAL
  Filled 2016-01-17 (×3): qty 1

## 2016-01-17 MED ORDER — PANTOPRAZOLE SODIUM 40 MG PO TBEC
40.0000 mg | DELAYED_RELEASE_TABLET | Freq: Every day | ORAL | Status: DC
  Administered 2016-01-17: 40 mg via ORAL
  Filled 2016-01-17: qty 1

## 2016-01-17 MED ORDER — HYDROMORPHONE HCL 2 MG PO TABS
2.0000 mg | ORAL_TABLET | ORAL | Status: DC | PRN
  Administered 2016-01-17: 2 mg via ORAL
  Filled 2016-01-17: qty 1

## 2016-01-17 MED ORDER — ALBUTEROL SULFATE (2.5 MG/3ML) 0.083% IN NEBU: 2.5000 mg | INHALATION_SOLUTION | RESPIRATORY_TRACT | Status: DC | PRN

## 2016-01-17 MED ORDER — DEXTROSE 5 % IV SOLN
1.0000 g | Freq: Once | INTRAVENOUS | Status: AC
  Administered 2016-01-17: 1 g via INTRAVENOUS
  Filled 2016-01-17: qty 10

## 2016-01-17 MED ORDER — ENOXAPARIN SODIUM 40 MG/0.4ML ~~LOC~~ SOLN
40.0000 mg | SUBCUTANEOUS | Status: DC
  Administered 2016-01-17: 40 mg via SUBCUTANEOUS
  Filled 2016-01-17: qty 0.4

## 2016-01-17 MED ORDER — ACETAMINOPHEN 650 MG RE SUPP
650.0000 mg | Freq: Four times a day (QID) | RECTAL | Status: DC | PRN
Start: 2016-01-17 — End: 2016-01-17

## 2016-01-17 MED ORDER — L-METHYLFOLATE-B6-B12 3-35-2 MG PO TABS
1.0000 | ORAL_TABLET | Freq: Every day | ORAL | Status: DC
  Administered 2016-01-17: 1 via ORAL
  Filled 2016-01-17: qty 1

## 2016-01-17 MED ORDER — POLYETHYLENE GLYCOL 3350 17 G PO PACK: 17.0000 g | PACK | Freq: Every day | ORAL | Status: DC | PRN

## 2016-01-17 MED ORDER — LORAZEPAM 0.5 MG PO TABS: 0.5000 mg | ORAL_TABLET | Freq: Four times a day (QID) | ORAL | Status: DC | PRN

## 2016-01-17 MED ORDER — CABOZANTINIB S-MALATE 40 MG PO TABS: 40.0000 mg | ORAL_TABLET | Freq: Every day | ORAL | Status: DC

## 2016-01-17 MED ORDER — ONDANSETRON HCL 4 MG PO TABS: 4.0000 mg | ORAL_TABLET | Freq: Four times a day (QID) | ORAL | Status: DC | PRN

## 2016-01-17 MED ORDER — CITALOPRAM HYDROBROMIDE 10 MG PO TABS
10.0000 mg | ORAL_TABLET | Freq: Every day | ORAL | Status: DC
  Administered 2016-01-17: 10 mg via ORAL
  Filled 2016-01-17: qty 1

## 2016-01-17 MED ORDER — GLUCERNA SHAKE PO LIQD
237.0000 mL | Freq: Three times a day (TID) | ORAL | Status: DC
  Administered 2016-01-17: 237 mL via ORAL
  Filled 2016-01-17 (×3): qty 237

## 2016-01-17 MED ORDER — TAMSULOSIN HCL 0.4 MG PO CAPS
0.4000 mg | ORAL_CAPSULE | Freq: Every day | ORAL | Status: DC
  Filled 2016-01-17: qty 1

## 2016-01-17 MED ORDER — ONDANSETRON HCL 4 MG/2ML IJ SOLN
4.0000 mg | Freq: Once | INTRAMUSCULAR | Status: AC
  Administered 2016-01-17: 4 mg via INTRAVENOUS
  Filled 2016-01-17: qty 2

## 2016-01-17 MED ORDER — SODIUM CHLORIDE 0.9 % IV BOLUS (SEPSIS)
1000.0000 mL | Freq: Once | INTRAVENOUS | Status: AC
  Administered 2016-01-17: 1000 mL via INTRAVENOUS

## 2016-01-17 MED ORDER — ONDANSETRON HCL 4 MG/2ML IJ SOLN: 4.0000 mg | Freq: Four times a day (QID) | INTRAMUSCULAR | Status: DC | PRN

## 2016-01-17 MED ORDER — OMEGA-3-ACID ETHYL ESTERS 1 G PO CAPS
1.0000 | ORAL_CAPSULE | Freq: Every day | ORAL | Status: DC
  Administered 2016-01-17: 1 g via ORAL
  Filled 2016-01-17: qty 1

## 2016-01-17 MED ORDER — OXYCODONE HCL 5 MG PO TABS: 5.0000 mg | ORAL_TABLET | ORAL | Status: DC | PRN

## 2016-01-17 MED ORDER — HYDROMORPHONE HCL 1 MG/ML IJ SOLN: 1.0000 mg | INTRAMUSCULAR | Status: DC | PRN

## 2016-01-17 MED ORDER — ADULT MULTIVITAMIN W/MINERALS CH
1.0000 | ORAL_TABLET | Freq: Every day | ORAL | Status: DC
  Administered 2016-01-17: 1 via ORAL
  Filled 2016-01-17: qty 1

## 2016-01-17 MED ORDER — SODIUM CHLORIDE 0.9 % IV SOLN
INTRAVENOUS | Status: DC
  Administered 2016-01-17: 04:00:00 via INTRAVENOUS

## 2016-01-17 MED ORDER — ACETAMINOPHEN 325 MG PO TABS: 650.0000 mg | ORAL_TABLET | Freq: Four times a day (QID) | ORAL | Status: DC | PRN

## 2016-01-17 MED ORDER — OXYCODONE HCL ER 10 MG PO T12A
10.0000 mg | EXTENDED_RELEASE_TABLET | Freq: Two times a day (BID) | ORAL | Status: DC
  Administered 2016-01-17: 10 mg via ORAL
  Filled 2016-01-17: qty 1

## 2016-01-17 MED ORDER — OXYCODONE HCL ER 10 MG PO T12A
30.0000 mg | EXTENDED_RELEASE_TABLET | Freq: Two times a day (BID) | ORAL | Status: DC
  Administered 2016-01-17: 30 mg via ORAL
  Filled 2016-01-17: qty 3

## 2016-01-17 MED ORDER — DOCUSATE SODIUM 100 MG PO CAPS
100.0000 mg | ORAL_CAPSULE | Freq: Two times a day (BID) | ORAL | Status: DC
  Administered 2016-01-17: 100 mg via ORAL
  Filled 2016-01-17: qty 1

## 2016-01-17 MED ORDER — PROCHLORPERAZINE EDISYLATE 5 MG/ML IJ SOLN: 10.0000 mg | INTRAMUSCULAR | Status: DC | PRN

## 2016-01-17 MED ORDER — DEXTROSE 5 % IV SOLN
500.0000 mg | Freq: Once | INTRAVENOUS | Status: AC
  Administered 2016-01-17: 500 mg via INTRAVENOUS
  Filled 2016-01-17: qty 500

## 2016-01-17 MED FILL — Medication: Qty: 1 | Status: AC

## 2016-01-24 NOTE — Progress Notes (Signed)
Pt now transferred to morgue.

## 2016-01-24 NOTE — Progress Notes (Signed)
Chaplain responded to a CODE BLUE while rounding.  Chaplain stood with wife, Joshua Black and daughter while CPR was being administered. Joshua Black did not respond to the CPR and died. Family members were called to come after the death.  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 She is aware of the exceptional care that is hers upon request. She reported that Joshua Black was up and laughing prior to his rapid decline. He had walked the hall and when he returned he began to decline in health. She is confident that he received everything possible to return to a steady heart beat.   Family faith community is expected to provide support and care immediately upon being informed of Joshua Black's death.  Sallee Lange. Tomio Kirk. DMin Chaplain

## 2016-01-24 NOTE — H&P (Addendum)
History and Physical    Joshua Black [id number] DOB: [birth year] DOA: 01/16/2016  Referring MD/NP/PA: Dr. Randal Buba PCP: No primary care provider on file.  Patient coming from: Home  Chief Complaint: Weakness  HPI: Joshua Black is a 70 y.o. male with medical history significant of metastatic renal cell carcinoma and chemotherapy, diabetes mellitus, prostate cancer; who presents with complaints of weakness with nausea, vomiting, and abdominal pain. Symptoms started 2 days ago where the patient had one episode of vomiting, then yesterday had 2 episodes of vomiting. vomitus is nonbilious Family became concerned as he had additional symptoms including loss of appetite, insomnia, extreme chills, malaise,  and generalized weakness. He states that he drinks Coke, but overall his oral intake has dropped off per wife. At home he is not on oxygen, but he has had some mild shortness of breath. Patient denies complaints of a cough, but is heard coughing in the room while being examined. He reports a weight loss of about 10 pounds over the last month. Family also noticed that his blood glucose levels were in the 80s and normally they are in the 120s. His only recent medication change has been the addition of Celexa for anxiety. Patient denies having any fever, diarrhea, difficulty urinating, sore throat, new rashes, or chest pain symptoms.  ED Course: Upon admission patient was evaluated and seemed to be afebrile, blood pressure seen as low as 97/70, otherwise all other vital signs within normal limits. Lab work revealed WBC 7.3, hemoglobin 12.6, platelets 1:15, BUN 33, creatinine 1.63. Chest x-ray shows a right lower lobe density concerning for pneumonia. Urinalysis had not been obtained at this time. [facility] called to admit.  Review of Systems: As per HPI otherwise 10 point review of systems negative.   Past Medical History  Diagnosis Date  . H/O prostate cancer   . Spinal cord compression ([place]) ?    ?  .  Left renal mass 10/06/12 CT Scan Abodoment    "Enlarging Exophytic  . Childhood asthma   . Diabetes ([place]) since 2006  . Pulmonary nodules 10/06/12 CT SCAN    Stable  . Hyperkalemia 10/12/12 dictation    "mild"  . Diabetes ([place])   . Morbid obesity ([place])   . S/P radiation therapy  10/26/2012-11/15/2012     T8-L1 / 37.5Gy in 15 fractions  . Renal cell cancer ([place])   . Bone metastases ([place]) 10/14/12 MRI     MRI DefinitiveOsseous Lesions - C3, C4, C6, C7, T1, T2, T9, T10, T11, T12 and L1 with Epidural and Right Neural Foraminal Invasion from T8-T12    Past Surgical History  Procedure Laterality Date  . Core biopsy and fine needle aspiration N/A 03/10/13    Bone Lesion - L1(A&B)  No Malignant Cells  . Right nephrectomy Right     with IVC Tumor Thrombectomy and a Retroperitoneal Lymph Node Dissection - Stage T3b: 0/8 negative Nodes  . Facial reconstructive surgery      Dog Bite  . Roator cuff repair Left      reports that he has never smoked. He does not have any smokeless tobacco history on file. He reports that he does not drink alcohol. His drug history is not on file.  No Known Allergies  Family History  Problem Relation Age of Onset  . Coronary artery disease Father   . Prostate cancer Brother   . Cancer  [place]    Prior to Admission medications  Medication Sig Start Date End Date Taking? Authorizing Provider  b complex vitamins capsule Take 1 capsule by mouth daily.    Yes Historical Provider, MD  CABOMETYX 40 MG TABS Take 40 mg by mouth daily.  12/29/15  Yes Historical Provider, MD  calcium-vitamin D ([facility] OYSTER SHELL CALCIUM-VIT D) 500-200 MG-UNIT tablet Take by mouth.   Yes Historical Provider, MD  citalopram (CELEXA) 20 MG tablet Take 10 mg by mouth daily.  12/31/15  Yes Historical Provider, MD  docusate sodium (STOOL SOFTENER) 100 MG capsule Take 100 mg by mouth 2 (two) times daily.    Yes Historical Provider, MD  Ginseng 100 MG CAPS Take 1,000 mg  by mouth daily.    Yes Historical Provider, MD  HYDROmorphone (DILAUDID) 2 MG tablet Take 2 mg by mouth every 4 (four) hours as needed for severe pain.   Yes Historical Provider, MD  lisinopril (PRINIVIL,ZESTRIL) 40 MG tablet Take 40 mg by mouth daily.  06/23/15 06/22/16 Yes Historical Provider, MD  LORazepam (ATIVAN) 0.5 MG tablet Take 0.5 mg by mouth every 6 (six) hours as needed for anxiety or sleep.  12/04/15  Yes Historical Provider, MD  Multiple Vitamins-Minerals (MULTIVITAL PLATINUM SILVER PO) Take 1 tablet by mouth daily.    Yes Historical Provider, MD  Omega-3 1000 MG CAPS Take 1 capsule by mouth daily.    Yes Historical Provider, MD  omeprazole (PRILOSEC) 20 MG capsule Take 20 mg by mouth daily.    Yes Historical Provider, MD  ondansetron (ZOFRAN) 8 MG tablet Take 1 tablet (8 mg total) by mouth every 8 (eight) hours as needed for nausea. 10/24/12  Yes Eppie Gibson, MD  oxycodone (OXY-IR) 5 MG capsule Take 5 mg by mouth every 4 (four) hours as needed for pain.    Yes Historical Provider, MD  oxyCODONE (OXYCONTIN) 10 MG 12 hr tablet Take 1 tablet (10 mg total) by mouth every 12 (twelve) hours. 12/19/12  Yes Eppie Gibson, MD  OXYCONTIN 30 MG 12 hr tablet Take 30 mg by mouth 2 (two) times daily.  12/29/15  Yes Historical Provider, MD  polyethylene glycol (MIRALAX / GLYCOLAX) packet Take 17 g by mouth daily as needed for mild constipation or moderate constipation.    Yes Historical Provider, MD  prochlorperazine (COMPAZINE) 10 MG tablet Take 1 tablet (10 mg total) by mouth every 6 (six) hours as needed. Patient taking differently: Take 10 mg by mouth every 6 (six) hours as needed for nausea or vomiting.  12/19/12  Yes Eppie Gibson, MD  tamsulosin (FLOMAX) 0.4 MG CAPS capsule Take 0.4 mg by mouth daily after supper.  08/29/15  Yes Historical Provider, MD  acetaminophen (TYLENOL) 500 MG tablet Take 500 mg by mouth every 6 (six) hours as needed for pain. Reported on 09/04/2015    Historical Provider, MD    albuterol (PROVENTIL HFA;VENTOLIN HFA) 108 (90 Base) MCG/ACT inhaler Inhale 2 puffs into the lungs every 6 (six) hours as needed for wheezing or shortness of breath.     Historical Provider, MD  traMADol (ULTRAM) 50 MG tablet Take 1 tablet (50 mg total) by mouth as needed for pain. Take 50mg  po Every 3-6 hours as needed Patient not taking: Reported on 09/04/2015 10/30/12   Eppie Gibson, MD    Physical Exam: Filed Vitals:   12/29/2015 2131 12/26/2015 2140 01/30/2016 0100 2016/01/30 0200  BP: 108/72  125/85 97/70  Pulse: 82  72 79  Temp: 97.5 F (36.4 C)     TempSrc: Oral  Resp: 16  18 18   SpO2: 88% 93% 93% 90%      Constitutional: Ill-appearing elderly male NAD, calm, comfortable Filed Vitals:   01/16/2016 2131 01/16/16 2140 09-Feb-2016 0100 02/09/2016 0200  BP: 108/72  125/85 97/70  Pulse: 82  72 79  Temp: 97.5 F (36.4 C)     TempSrc: Oral     Resp: 16  18 18   SpO2: 88% 93% 93% 90%   Eyes: PERRL, lids and conjunctivae normal ENMT: Mucous membranes are dry. Posterior pharynx clear of any exudate or lesions.Normal dentition.  Neck: normal, supple, no masses, no thyromegaly Respiratory:Decreased breath sounds on the right lower lobe no wheezing, no crackles. Normal respiratory effort. No accessory muscle use.  Cardiovascular: Regular rate and rhythm, no murmurs / rubs / gallops. No extremity edema. 2+ pedal pulses. No carotid bruits.  Abdomen: no tenderness. no hepatosplenomegaly. Bowel sounds positive.  Musculoskeletal: no clubbing / cyanosis. No joint deformity upper and lower extremities. Good ROM, no contractures. Normal muscle tone.  Skin: no rashes, lesions, ulcers. No induration Neurologic: CN 2-12 grossly intact. Sensation intact, DTR normal. Strength 5/5 in all 4.  Psychiatric: Normal judgment and insight. Alert and oriented x 3. Normal mood.     Labs on Admission: I have personally reviewed following labs and imaging studies  CBC:  Recent Labs Lab 02/09/16 0037  2016/02/09 0049  WBC 7.3  --   NEUTROABS 5.9  --   HGB 11.7* 12.6*  HCT 35.6* 37.0*  MCV 82.6  --   PLT 115*  --    Basic Metabolic Panel:  Recent Labs Lab 02/09/16 0037 02-09-2016 0049  NA 137 138  K 4.6 4.5  CL 102 100*  CO2 27  --   GLUCOSE 80 72  BUN 33* 33*  CREATININE 1.63* 1.50*  CALCIUM 8.0*  --    GFR: CrCl cannot be calculated (Unknown ideal weight.). Liver Function Tests:  Recent Labs Lab February 09, 2016 0037  AST 44*  ALT 36  ALKPHOS 90  BILITOT 0.7  PROT 7.0  ALBUMIN 2.7*   No results for input(s): LIPASE, AMYLASE in the last 168 hours. No results for input(s): AMMONIA in the last 168 hours. Coagulation Profile: No results for input(s): INR, PROTIME in the last 168 hours. Cardiac Enzymes: No results for input(s): CKTOTAL, CKMB, CKMBINDEX, TROPONINI in the last 168 hours. BNP (last 3 results) No results for input(s): PROBNP in the last 8760 hours. HbA1C: No results for input(s): HGBA1C in the last 72 hours. CBG: No results for input(s): GLUCAP in the last 168 hours. Lipid Profile: No results for input(s): CHOL, HDL, LDLCALC, TRIG, CHOLHDL, LDLDIRECT in the last 72 hours. Thyroid Function Tests: No results for input(s): TSH, T4TOTAL, FREET4, T3FREE, THYROIDAB in the last 72 hours. Anemia Panel: No results for input(s): VITAMINB12, FOLATE, FERRITIN, TIBC, IRON, RETICCTPCT in the last 72 hours. Urine analysis: No results found for: COLORURINE, APPEARANCEUR, LABSPEC, PHURINE, GLUCOSEU, HGBUR, BILIRUBINUR, KETONESUR, PROTEINUR, UROBILINOGEN, NITRITE, LEUKOCYTESUR Sepsis Labs: No results found for this or any previous visit (from the past 240 hour(s)).   Radiological Exams on Admission: Dg Chest 2 View  2016/02/09  CLINICAL DATA:  70 year old male with vomiting and chills. EXAM: CHEST  2 VIEW COMPARISON:  Chest CT dated 10/06/2012 FINDINGS: There is a confluent area of airspace opacity in the right lung base/right lower lobe most concerning for pneumonia. A  small right pleural effusion is also noted. The left lung is clear. There is no pneumothorax. The cardiac silhouette is  within normal limits. No acute osseous pathology. IMPRESSION: Right lower lobe density concerning for pneumonia. Correlation with clinical exam and follow-up to resolution recommended. Electronically Signed   By: Anner Crete M.D.   On: 01-20-16 01:32    EKG: Independently reviewed. Sinus rhythm  Assessment/Plan Nausea and vomiting: acute. 2 day history with reports of abdominal pain. - Admit to MedSurg. - Zofran/Compazine IV prn - Advanced diet as tolerated  Dehydration with acute kidney injury on chronic kidney disease: Patient with elevated creatinine of 1.63 and a BUN of 33. Upon review of care if patient's last creatinine was noted to be between 1.1 and 1.2 just last month. - Normal saline IV fluids - Follow-up repeat BMP  - Held nephrotoxic agents at this time   Pneumonia: Aspiration versus CAP on the right lower lobe - Started on ceftriaxone and Rocephin in the ED to assess and determine if this needs to be continued  Metastatic renal cell carcinoma currently on oral chemotherapy - Will need to contact patient's oncologist Dr. Karenann Cai at [facility] - Continue pain regimen including Dilaudid prn, oxycodone prn, and OxyContin - IV Dilaudid in place prn during acute period of nausea and vomiting  Hypoalbuminemia: Albumin 2.4 on admission - Glucerna shakes as tolerated  Essential hypertension  - Held lisinopril secondary to acute kidney injury  Anxiety - Continue Ativan and Celexa for now - May want to consider changing/adding Remeron as it helps with anxiety, sleep, and appetite possibly  BPH - Continue Flomax  GERD - Pharmacy substitution of Protonix   DVT prophylaxis:  lovenox Code Status: Full  Family Communication: Discussed overall plan with the patient's wife and son present at bedside  Disposition Plan: Possible discharge  home in 1-2 days  Consults called: None  Admission status:Observation MedSurg  Norval Morton MD [facility] Pager [phone]  If 7PM-7AM, please contact night-coverage [email or web address] Password [facility]  01/20/2016, 2:49 AM

## 2016-01-24 NOTE — Discharge Summary (Addendum)
   Death Summary  AMONTI STEINHAGEN [id number] DOB: [birth year] DOA: 02/07/2016   Admit date: 2016-02-07 Date of Death: 2016/02/08  Final Diagnoses:  Principal Problem:   Renal cell carcinoma (HCC) Nausea and vomiting   Malignant neoplasm metastatic to bone (HCC)   PNA (pneumonia)   Dehydration . Acute kidney injury on chronic kidney disease Hypoalbuminemia Essential hypertension Anxiety disorder History of prostate cancer GERD  History of present illness:  70 year old Caucasian male with a past medical history of metastatic renal cell cancer on chemotherapy, diabetes, prostate cancer, presented with nausea, vomiting and abdominal pain. Patient was evaluated in the emergency department and was found to be hypotensive and dehydrated. He also had abnormalities on his chest x-ray suggesting pneumonia.  Hospital Course:  Patient was hospitalized for further management. He was given fluids for his dehydration and renal failure. His blood pressure responded appropriately to fluids. He was making urine. His nausea and vomiting subsided. His abdomen remained benign. He was treated with antibiotics for his pneumonia. His ACE inhibitor was held due to low blood pressure as well as acute kidney injury. Patient was actually feeling better this morning.  While he was being ambulated by physical therapy, patient complained of feeling dizzy. He was noted to have low blood pressure. Subsequently, he became hypoxic. And then he proceeded to becoming unresponsive. He went into cardiac arrest. CPR was initiated and performed for about 20 minutes. However, patient did not have return of spontaneous circulation.  He was pronounced dead on 08-Feb-2016 11:19 AM. Patient's wife declined autopsy.  Etiology for his cardiac arrest is not entirely clear. He did have PEA, so acute pulmonary embolism is a possibility considering history of cancer.. Hypovolemia is another possibility.  At this time cause of death would  be metastatic renal cell cancer.   Burrel Legrand  [facility] 08-Feb-2016, 2:08 PM

## 2016-01-24 NOTE — Progress Notes (Signed)
Writer in patient's room giving him 1000 meds. PT present and waiting to walk pt in hallway. Pt sat on edge of bed talking to staff and taking his medications. Spouse and daughter in room. Pt walked with PT to end of hallway and stated he was "dizzy" and became difficult to arouse. Nurse went to end of hallway and helped PT to get pt chair and hooked to Dinamap to check VS. O2 RA at 70. BP 85/56. Pt able to respond to questions at times but his eyes continue to close and even with 6 l/min O2 via [place], O2 sats are not moving up. Writer went to Advertising account executive to call for RT and RR; when arrived back to room, pt unable to arouse. Writer called Code Blue and pulled room button. Pt pulled to bed by Probation officer and staff began CPR. RR, RT, and physician arrived.

## 2016-01-24 NOTE — Progress Notes (Signed)
Code Note  Apparently patient was ambulated by physical therapy. He had walked about 75 feet when he started complaining of feeling dizzy and lightheaded. Patient was brought back to the room. Patient was found to be hypotensive. And then subsequently became hypoxic. And then he proceeded to become unresponsive. CODE BLUE was called.  CPR was in progress upon arrival to the bedside. Patient had been given 1 mg of epinephrine. ACLS Protocol was followed. Patient was noted to be in PEA. Patient regained pulse briefly. No blood pressure was appreciated. Patient arrested again. CPR was again initiated. His wife and daughter were at the bedside. They were constantly updated.  After about 20 minutes of CPR. There was no ROSC. Poor prognosis was relayed to the patient's family. His wife then asked [place] to stop doing CPR.  Patient was pronounced dead at 11:19 AM on 01/18/2069.  Joshua Black 01-19-16

## 2016-01-24 NOTE — Evaluation (Signed)
Physical Therapy Evaluation Patient Details Name: Joshua Black MRN: [id number] DOB: [birth year] Today's Date: 02-01-2016   History of Present Illness  Joshua Black is a 70 y.o. male with medical history significant of metastatic renal cell carcinoma and chemotherapy, diabetes mellitus, prostate cancer; who presents with complaints of weakness with nausea, vomiting, and abdominal pain. Symptoms started 2 days ago where the patient had one episode of vomiting, then yesterday had 2 episodes of vomiting. vomitus is nonbilious Family became concerned as he had additional symptoms including loss of appetite, insomnia, extreme chills, malaise, and generalized weakness  Clinical Impression  The  Patient's family  Arrived prior to evaluation. The patient sat on the side of the bed  For 10 minutes, conversing and taking pills. Patient ambulated x 46 ' then with complaints of dizziness. RN assessed with noted decline. Code blue called. Patient expired.    Follow Up Recommendations   NA   Equipment Recommendations       Recommendations for Other Services       Precautions / Restrictions Precautions Precautions: Fall      Mobility  Bed Mobility Overal bed mobility: Independent                Transfers Overall transfer level: Needs assistance Equipment used: Rolling walker (2 wheeled) Transfers: Sit to/from Stand Sit to Stand: Supervision            Ambulation/Gait Ambulation/Gait assistance: Min guard Ambulation Distance (Feet): 75 Feet Assistive device: Rolling walker (2 wheeled) Gait Pattern/deviations: Step-through pattern Gait velocity: 65. returned to room via recliner with rapid decline in sats Code Blue called.   General Gait Details: patient complained of feeling  dizzy. recliner brought up.RN  came to evaluate. VS taken, noted sats [id number]  Stairs            Wheelchair Mobility    Modified Rankin (Stroke Patients Only)       Balance                                             Pertinent Vitals/Pain Pain Assessment: Faces Faces Pain Scale: Hurts little more Pain Location: abdomen    Home Living Family/patient expects to be discharged to:: Private residence Living Arrangements: Spouse/significant other Available Help at Discharge: Family Type of Home: House Home Access: Stairs to enter   Technical brewer of Steps: 3 Home Layout: Two level;Able to live on main level with bedroom/bathroom Home Equipment: Gilford Rile - 2 wheels      Prior Function Level of Independence: Independent               Hand Dominance        Extremity/Trunk Assessment   Upper Extremity Assessment: Generalized weakness           Lower Extremity Assessment: Generalized weakness      Cervical / Trunk Assessment: Normal  Communication   Communication: No difficulties  Cognition Arousal/Alertness: Awake/alert Behavior During Therapy: WFL for tasks assessed/performed Overall Cognitive Status: Within Functional Limits for tasks assessed                      General Comments      Exercises        Assessment/Plan    PT Assessment    PT Diagnosis     PT Problem List    PT Treatment  Interventions     PT Goals (Current goals can be found in the Care Plan section)      Frequency     Barriers to discharge        Co-evaluation               End of Session   Activity Tolerance: Treatment limited secondary to medical complications (Comment) Patient left:  (CODe Ble team responded)           Time:  [id number]     Charges:  Eval       PT G Codes:        Claretha Cooper 01-23-2016, 12:20 PM

## 2016-01-24 NOTE — Anesthesia Procedure Notes (Signed)
Procedure Name: Intubation Date/Time: Feb 11, 2016 11:10 AM Performed by: Lajuana Carry E Pre-anesthesia Checklist: Patient identified, Emergency Drugs available, Suction available and Patient being monitored Oxygen Delivery Method: Ambu bag Preoxygenation: Pre-oxygenation with 100% oxygen Ventilation: Mask ventilation without difficulty Laryngoscope Size: Miller and 3 Grade View: Grade I Tube type: Oral Tube size: 7.5 mm Number of attempts: 1 Airway Equipment and Method: Stylet and Oral airway Placement Confirmation: ETT inserted through vocal cords under direct vision,  breath sounds checked- equal and bilateral and CO2 detector Secured at: 23 cm Tube secured with: Tape Dental Injury: Teeth and Oropharynx as per pre-operative assessment  Comments: Called for Code Blue, CPR in progress, vomit noted on patient's beard. Suction available and oropharynx suctioned prior to ETT placement. ETT secured in place with tape by RT. Left in care of RT and Code team leader

## 2016-01-24 DEATH — deceased

## 2017-08-01 IMAGING — CR DG CHEST 2V
2 series · 2 of 2 positions shown · non-contrast
Comparison: Chest CT dated 10/06/2012

CLINICAL DATA: 70-year-old male with vomiting and chills.

EXAM:
CHEST  2 VIEW

[w chest lat]
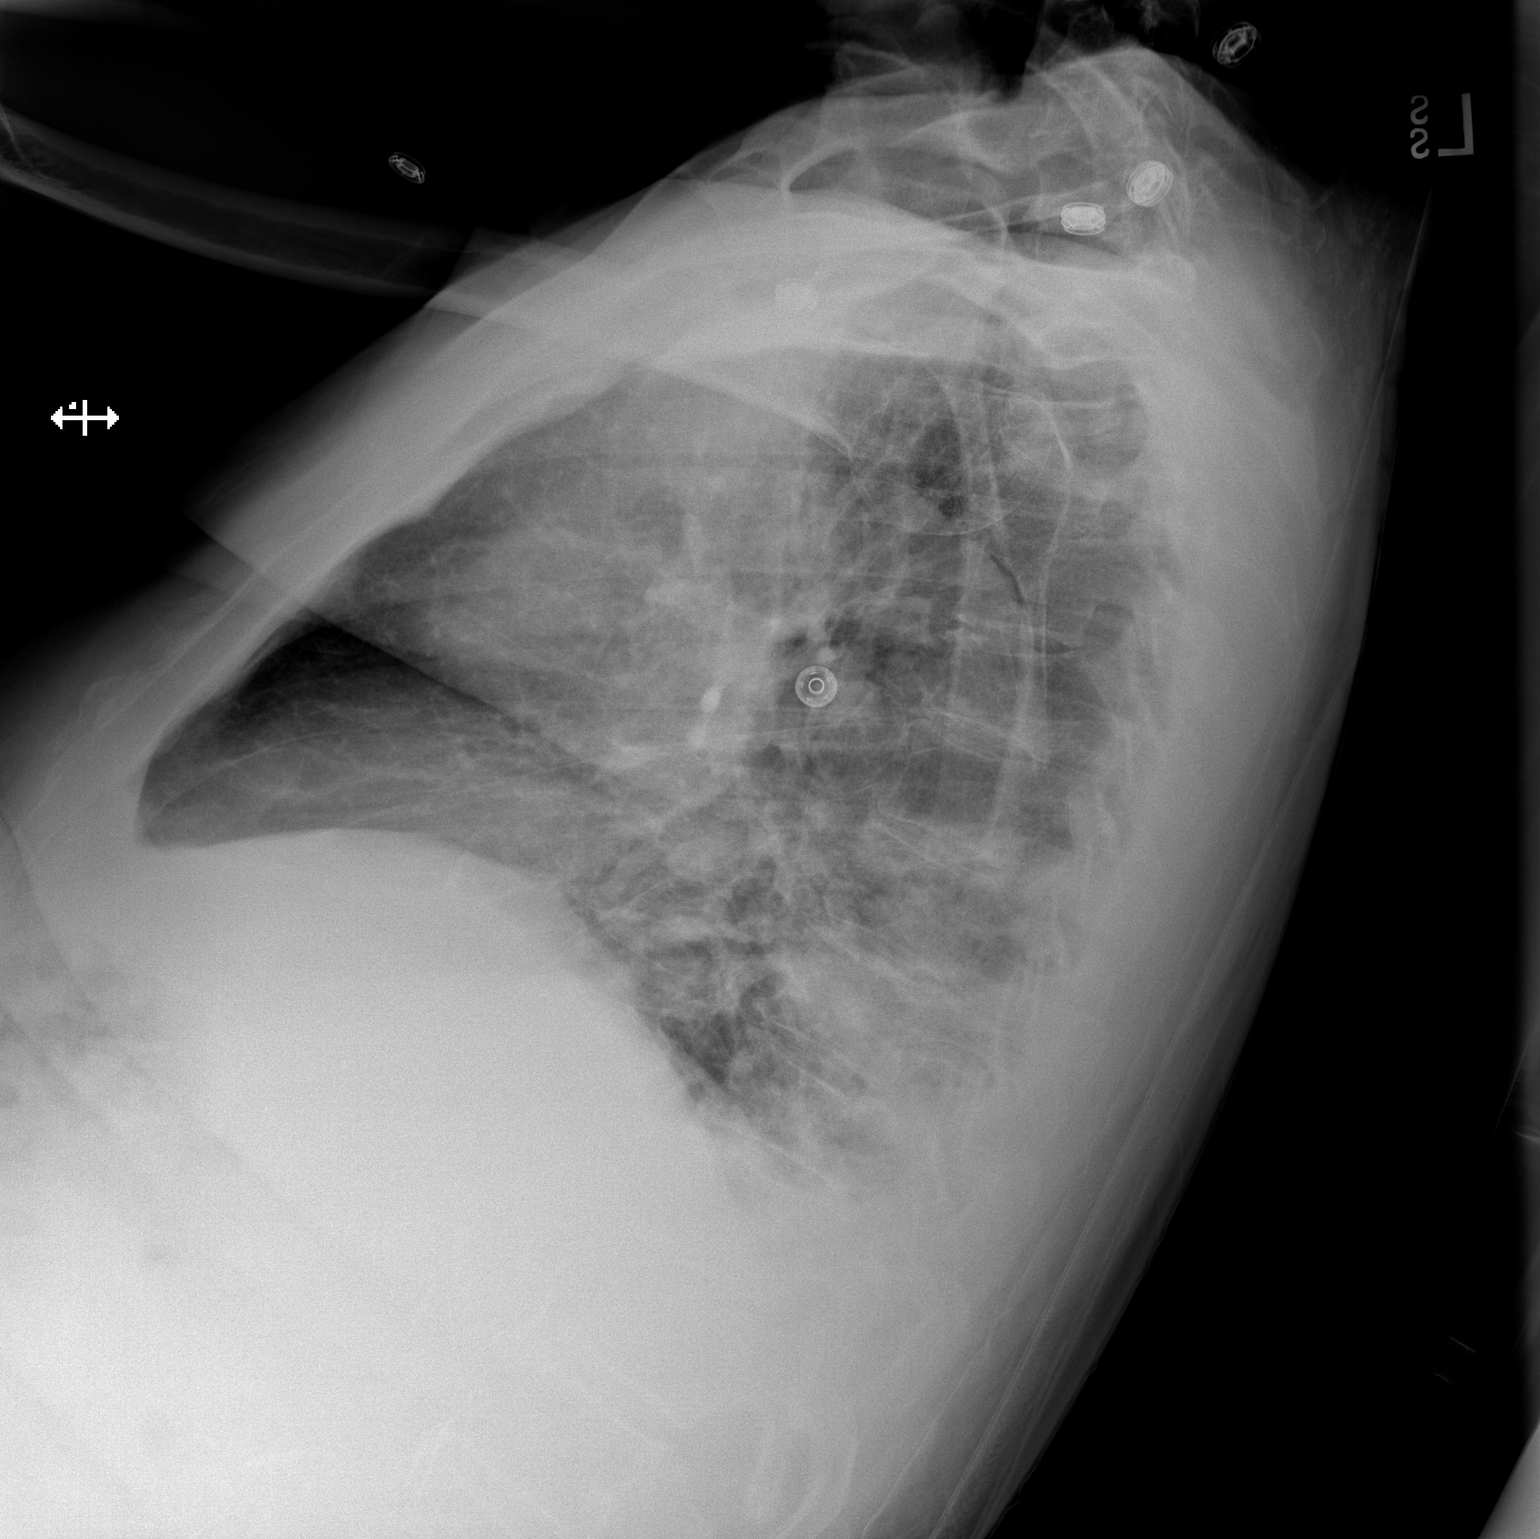

[x chest ap]
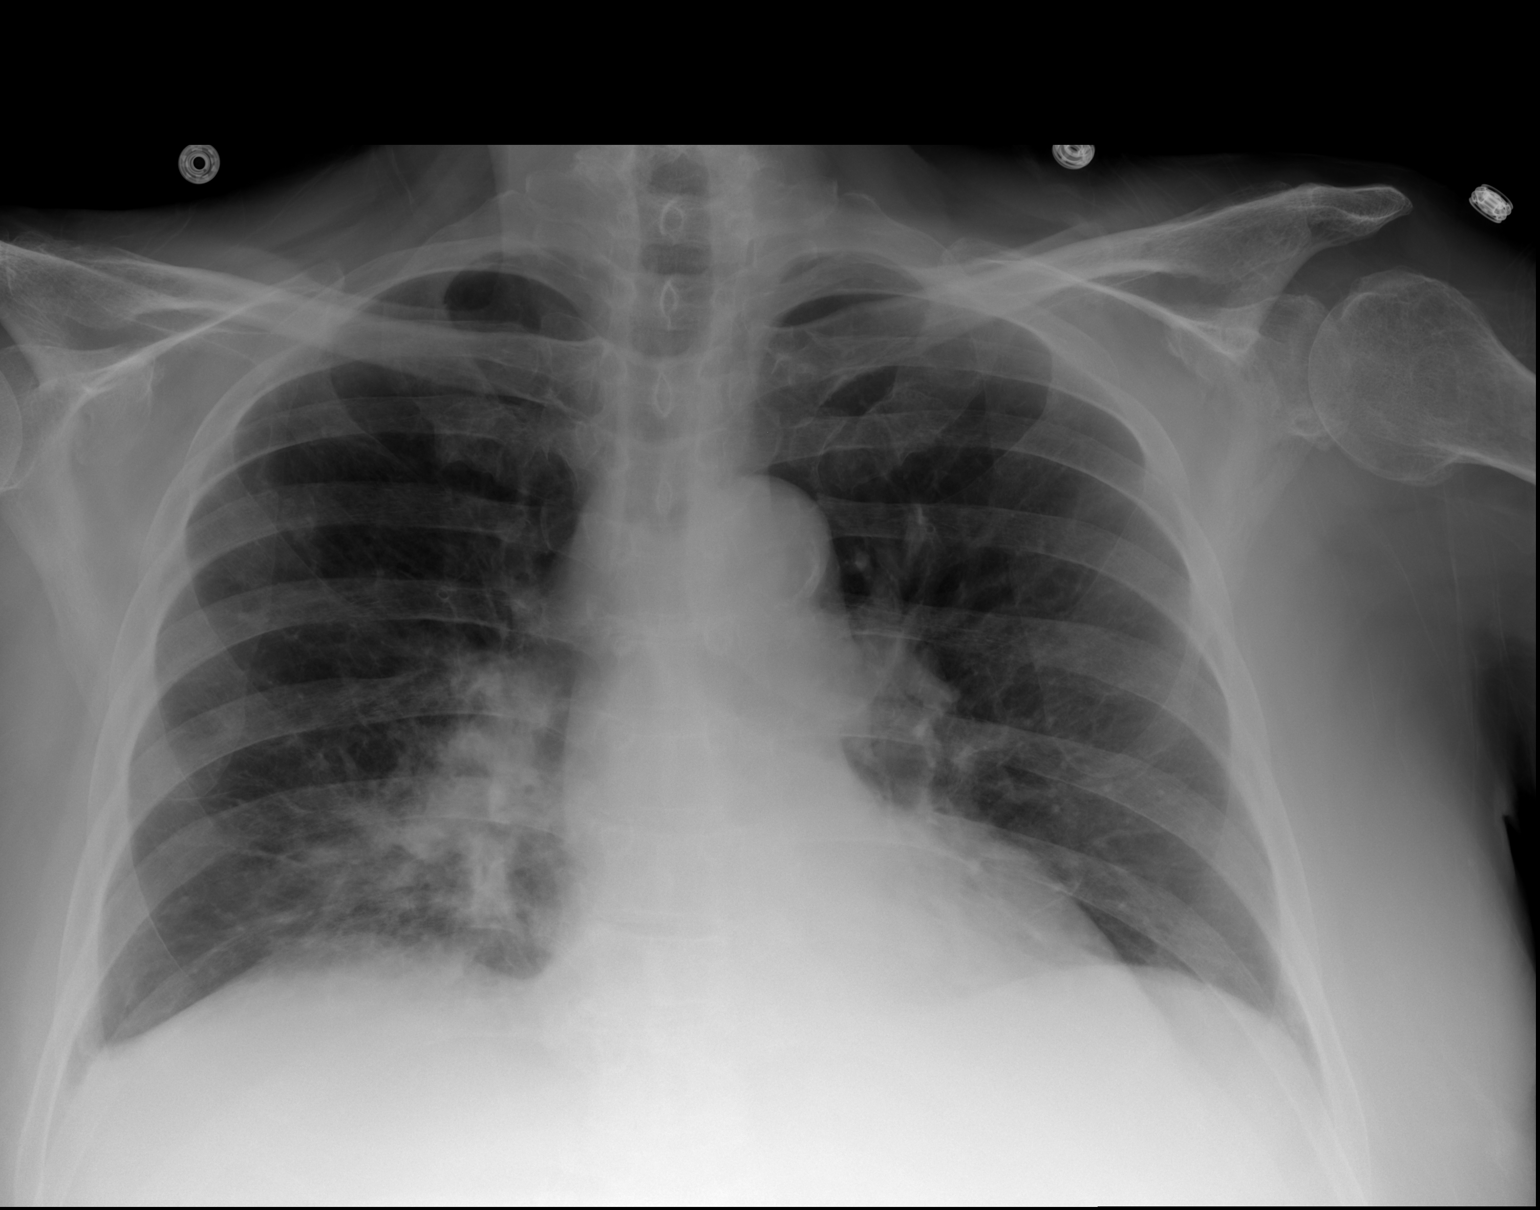

[2 of 2 positions shown; findings below may reference images not displayed]

FINDINGS: There is a confluent area of airspace opacity in the right lung
base/right lower lobe most concerning for pneumonia. A small right
pleural effusion is also noted. The left lung is clear. There is no
pneumothorax. The cardiac silhouette is within normal limits. No
acute osseous pathology.
IMPRESSION: Right lower lobe density concerning for pneumonia. Correlation with
clinical exam and follow-up to resolution recommended.
